# Patient Record
Sex: Female | Born: 1955 | Race: White | Hispanic: No | Marital: Married | Smoking: Current every day smoker
Health system: Southern US, Community
[De-identification: ages and names within clinical notes are randomized; demographics above are authoritative.]

## PROBLEM LIST (undated history)

## (undated) DIAGNOSIS — Z720 Tobacco use: Secondary | ICD-10-CM

## (undated) DIAGNOSIS — I10 Essential (primary) hypertension: Secondary | ICD-10-CM

## (undated) DIAGNOSIS — J45909 Unspecified asthma, uncomplicated: Secondary | ICD-10-CM

## (undated) HISTORY — PX: TONSILLECTOMY: SUR1361

## (undated) HISTORY — PX: NECK SURGERY: SHX720

---

## 2015-01-15 ENCOUNTER — Emergency Department (HOSPITAL_BASED_OUTPATIENT_CLINIC_OR_DEPARTMENT_OTHER): Payer: BLUE CROSS/BLUE SHIELD

## 2015-01-15 ENCOUNTER — Emergency Department (HOSPITAL_COMMUNITY): Payer: BLUE CROSS/BLUE SHIELD

## 2015-01-15 ENCOUNTER — Inpatient Hospital Stay (HOSPITAL_BASED_OUTPATIENT_CLINIC_OR_DEPARTMENT_OTHER)
Admission: EM | Admit: 2015-01-15 | Discharge: 2015-01-17 | DRG: 065 | Disposition: A | Discharge status: Home / Self Care | Payer: BLUE CROSS/BLUE SHIELD | Attending: Internal Medicine | Admitting: Internal Medicine

## 2015-01-15 ENCOUNTER — Encounter (HOSPITAL_BASED_OUTPATIENT_CLINIC_OR_DEPARTMENT_OTHER): Payer: Self-pay

## 2015-01-15 DIAGNOSIS — G8190 Hemiplegia, unspecified affecting unspecified side: Secondary | ICD-10-CM

## 2015-01-15 DIAGNOSIS — Z9119 Patient's noncompliance with other medical treatment and regimen: Secondary | ICD-10-CM | POA: Diagnosis present

## 2015-01-15 DIAGNOSIS — F17210 Nicotine dependence, cigarettes, uncomplicated: Secondary | ICD-10-CM | POA: Diagnosis present

## 2015-01-15 DIAGNOSIS — I639 Cerebral infarction, unspecified: Principal | ICD-10-CM | POA: Diagnosis present

## 2015-01-15 DIAGNOSIS — R531 Weakness: Secondary | ICD-10-CM

## 2015-01-15 DIAGNOSIS — G8191 Hemiplegia, unspecified affecting right dominant side: Secondary | ICD-10-CM

## 2015-01-15 DIAGNOSIS — G43909 Migraine, unspecified, not intractable, without status migrainosus: Secondary | ICD-10-CM | POA: Diagnosis present

## 2015-01-15 DIAGNOSIS — E785 Hyperlipidemia, unspecified: Secondary | ICD-10-CM | POA: Diagnosis present

## 2015-01-15 DIAGNOSIS — Z720 Tobacco use: Secondary | ICD-10-CM | POA: Diagnosis not present

## 2015-01-15 DIAGNOSIS — Z880 Allergy status to penicillin: Secondary | ICD-10-CM | POA: Diagnosis not present

## 2015-01-15 DIAGNOSIS — Z7982 Long term (current) use of aspirin: Secondary | ICD-10-CM | POA: Diagnosis not present

## 2015-01-15 DIAGNOSIS — I10 Essential (primary) hypertension: Secondary | ICD-10-CM | POA: Diagnosis present

## 2015-01-15 DIAGNOSIS — I6789 Other cerebrovascular disease: Secondary | ICD-10-CM | POA: Diagnosis not present

## 2015-01-15 DIAGNOSIS — G8929 Other chronic pain: Secondary | ICD-10-CM | POA: Diagnosis present

## 2015-01-15 DIAGNOSIS — J45909 Unspecified asthma, uncomplicated: Secondary | ICD-10-CM | POA: Diagnosis present

## 2015-01-15 HISTORY — DX: Tobacco use: Z72.0

## 2015-01-15 HISTORY — DX: Unspecified asthma, uncomplicated: J45.909

## 2015-01-15 HISTORY — DX: Essential (primary) hypertension: I10

## 2015-01-15 LAB — CBC
HEMATOCRIT: 43.8 % (ref 36.0–46.0)
Hemoglobin: 15.3 g/dL — ABNORMAL HIGH (ref 12.0–15.0)
MCH: 30.4 pg (ref 26.0–34.0)
MCHC: 34.9 g/dL (ref 30.0–36.0)
MCV: 87.1 fL (ref 78.0–100.0)
PLATELETS: 252 10*3/uL (ref 150–400)
RBC: 5.03 MIL/uL (ref 3.87–5.11)
RDW: 13.3 % (ref 11.5–15.5)
WBC: 6.7 10*3/uL (ref 4.0–10.5)

## 2015-01-15 LAB — COMPREHENSIVE METABOLIC PANEL
ALBUMIN: 4.3 g/dL (ref 3.5–5.2)
ALK PHOS: 67 U/L (ref 39–117)
ALT: 17 U/L (ref 0–35)
AST: 28 U/L (ref 0–37)
Anion gap: 9 (ref 5–15)
BUN: 7 mg/dL (ref 6–23)
CO2: 27 mmol/L (ref 19–32)
CREATININE: 0.72 mg/dL (ref 0.50–1.10)
Calcium: 9.5 mg/dL (ref 8.4–10.5)
Chloride: 99 mmol/L (ref 96–112)
GFR calc Af Amer: >90 mL/min (ref >90)
GFR calc non Af Amer: >90 mL/min (ref >90)
Glucose, Bld: 104 mg/dL — ABNORMAL HIGH (ref 70–99)
Potassium: 3.9 mmol/L (ref 3.5–5.1)
Sodium: 135 mmol/L (ref 135–145)
TOTAL PROTEIN: 7.5 g/dL (ref 6.0–8.3)
Total Bilirubin: 0.6 mg/dL (ref 0.3–1.2)

## 2015-01-15 LAB — DIFFERENTIAL
BASOS PCT: 0 % (ref 0–1)
Basophils Absolute: 0 10*3/uL (ref 0.0–0.1)
EOS ABS: 0.1 10*3/uL (ref 0.0–0.7)
Eosinophils Relative: 1 % (ref 0–5)
Lymphocytes Relative: 35 % (ref 12–46)
Lymphs Abs: 2.3 10*3/uL (ref 0.7–4.0)
MONOS PCT: 12 % (ref 3–12)
Monocytes Absolute: 0.8 10*3/uL (ref 0.1–1.0)
Neutro Abs: 3.5 10*3/uL (ref 1.7–7.7)
Neutrophils Relative %: 52 % (ref 43–77)

## 2015-01-15 LAB — PROTIME-INR
INR: 0.93 (ref 0.00–1.49)
Prothrombin Time: 12.5 seconds (ref 11.6–15.2)

## 2015-01-15 LAB — ETHANOL

## 2015-01-15 LAB — TROPONIN I: Troponin I: <0.03 ng/mL (ref <0.031)

## 2015-01-15 LAB — APTT: aPTT: 28 seconds (ref 24–37)

## 2015-01-15 MED ORDER — SENNOSIDES-DOCUSATE SODIUM 8.6-50 MG PO TABS: 1.0000 | ORAL_TABLET | Freq: Every evening | ORAL | Status: DC | PRN

## 2015-01-15 MED ORDER — ATORVASTATIN CALCIUM 40 MG PO TABS
40.0000 mg | ORAL_TABLET | Freq: Every day | ORAL | Status: DC
  Administered 2015-01-16: 40 mg via ORAL
  Filled 2015-01-15: qty 1

## 2015-01-15 MED ORDER — NICOTINE 21 MG/24HR TD PT24
21.0000 mg | MEDICATED_PATCH | Freq: Every day | TRANSDERMAL | Status: DC
  Administered 2015-01-16 – 2015-01-17 (×2): 21 mg via TRANSDERMAL
  Filled 2015-01-15 (×2): qty 1

## 2015-01-15 MED ORDER — ASPIRIN 300 MG RE SUPP: 300.0000 mg | Freq: Every day | RECTAL | Status: DC

## 2015-01-15 MED ORDER — ASPIRIN 325 MG PO TABS
325.0000 mg | ORAL_TABLET | Freq: Every day | ORAL | Status: DC
  Administered 2015-01-16: 325 mg via ORAL
  Filled 2015-01-15: qty 1

## 2015-01-15 MED ORDER — STROKE: EARLY STAGES OF RECOVERY BOOK
Freq: Once | Status: AC
  Administered 2015-01-15

## 2015-01-15 MED ORDER — HEPARIN SODIUM (PORCINE) 5000 UNIT/ML IJ SOLN
5000.0000 [IU] | Freq: Three times a day (TID) | INTRAMUSCULAR | Status: DC
  Administered 2015-01-15 – 2015-01-17 (×6): 5000 [IU] via SUBCUTANEOUS
  Filled 2015-01-15 (×6): qty 1

## 2015-01-15 NOTE — ED Notes (Signed)
Pt reports she tripped and foot hit brick wall.  Pt reports it gave her a laceration to bottom of right foot and now she has numbness from right knee down.

## 2015-01-15 NOTE — ED Notes (Signed)
Patient transported to CT 

## 2015-01-15 NOTE — ED Notes (Signed)
Doctor at bedside.

## 2015-01-15 NOTE — H&P (Signed)
[facility] History and Physical  Olivia DestineJamie Lin UJW:[id number]:[id number] DOB: April 21, 1956 DOA: 01/15/2015  Referring physician: ED physician PCP: Cheral BayHAWKS,ALDENE N, MD  Specialists:   Chief Complaint: right leg weakness and numbness  HPI: Olivia DestineJamie Lin is a 59 y.o. female with past medical history for hypertension, tobacco abuse, who presents with right leg weakness and numbness  Patient reports that she has been having right-sided weakness and numbness in the past 2 days, mainly in right leg. She states that Monday, she tripped and injured her right foot. Reports falling on her right hip during this episode.   ROS: currently patient denies fever, chills, fatigue, running nose, ear pain, headaches, cough, chest pain, SOB, abdominal pain, diarrhea, constipation, dysuria, urgency, frequency, hematuria, skin rashes, joint pain or leg swelling. No vision change or hearing loss. Patient was seen in ED by dr. Hosie PoissonSumner who recommended to check MRI brain and L spine without contrast, which showed multiple small parasaggital infarcts bilaterally.   In ED, patient was found to have negative troponin, INR 0.73, temperature normal, no tachycardia, electrolytes okay. CT head is negative for acute abnormalities. Patient is admitted to inpatient for further evaluation and treatment.  Review of Systems: As presented in the history of presenting illness, rest negative.  Where does patient live?  At home Can patient participate in ADLs? Yes  Allergy:  Allergies  Allergen Reactions  . Penicillins Other (See Comments)    Past Medical History  Diagnosis Date  . Hypertension   . Tobacco abuse     Past Surgical History  Procedure Laterality Date  . Neck surgery    . Tonsillectomy      Social History:  reports that she has been smoking.  She does not have any smokeless tobacco history on file. She reports that she drinks alcohol. She reports that she does not use illicit drugs.  Family History:  Family  History  Problem Relation Age of Onset  . COPD Mother   . Breast cancer Mother   . Stroke Father   . Diabetes Sister   . Stroke Sister      Prior to Admission medications   Medication Sig Start Date End Date Taking? Authorizing Provider  ibuprofen (ADVIL,MOTRIN) 200 MG tablet Take 200 mg by mouth every 6 (six) hours as needed for fever.   Yes Historical Provider, MD  metaxalone (SKELAXIN) 800 MG tablet Take 800 mg by mouth daily as needed for muscle spasms (HEADACHES).   Yes Historical Provider, MD    Physical Exam: Filed Vitals:   01/16/15 0000 01/16/15 0200 01/16/15 0400 01/16/15 0602  BP: 184/84 146/88 167/83 141/85  Pulse: 72 67 64 74  Temp: 97.5 F (36.4 C) 97.9 F (36.6 C) 97.9 F (36.6 C) 97.9 F (36.6 C)  TempSrc: Oral Oral Oral Oral  Resp: 18 20 18 18   Height:      Weight:      SpO2: 94% 96% 97% 96%   General: Not in acute distress HEENT:       Eyes: PERRL, EOMI, no scleral icterus       ENT: No discharge from the ears and nose, no pharynx injection, no tonsillar enlargement.        Neck: No JVD, no bruit, no mass felt. Cardiac: S1/S2, RRR, No murmurs, No gallops or rubs Pulm: Good air movement bilaterally. Clear to auscultation bilaterally. No rales, wheezing, rhonchi or rubs. Abd: Soft, nondistended, nontender, no rebound pain, no organomegaly, BS present Ext: No edema bilaterally. 2+DP/PT pulse bilaterally Musculoskeletal:  No joint deformities, erythema, or stiffness, ROM full Skin: No rashes.  Neuro: Alert and oriented X3, cranial nerves II-XII grossly intact, muscle strength 3/5 in right leg, 5/5 in other extremeties, sensation to light touch decreased in right lower leg.  Brachial reflex 2+ bilaterally. Knee reflex 1+ bilaterally. Positive Babinski's sign on the right side. Normal finger to nose test. Psych: Patient is not psychotic, no suicidal or hemocidal ideation.  Labs on Admission:  Basic Metabolic Panel:  Recent Labs Lab 01/15/15 1355  NA 135   K 3.9  CL 99  CO2 27  GLUCOSE 104*  BUN 7  CREATININE 0.72  CALCIUM 9.5   Liver Function Tests:  Recent Labs Lab 01/15/15 1355  AST 28  ALT 17  ALKPHOS 67  BILITOT 0.6  PROT 7.5  ALBUMIN 4.3   No results for input(s): LIPASE, AMYLASE in the last 168 hours. No results for input(s): AMMONIA in the last 168 hours. CBC:  Recent Labs Lab 01/15/15 1355  WBC 6.7  NEUTROABS 3.5  HGB 15.3*  HCT 43.8  MCV 87.1  PLT 252   Cardiac Enzymes:  Recent Labs Lab 01/15/15 1355  TROPONINI <0.03    BNP (last 3 results) No results for input(s): BNP in the last 8760 hours.  ProBNP (last 3 results) No results for input(s): PROBNP in the last 8760 hours.  CBG: No results for input(s): GLUCAP in the last 168 hours.  Radiological Exams on Admission: Ct Head Wo Contrast  01/15/2015   CLINICAL DATA:  Tripped and fell today, dizziness, denies hitting head, initial encounter  EXAM: CT HEAD WITHOUT CONTRAST  TECHNIQUE: Contiguous axial images were obtained from the base of the skull through the vertex without intravenous contrast.  COMPARISON:  None  FINDINGS: Normal ventricular morphology.  No midline shift or mass effect.  Normal appearance of brain parenchyma.  No intracranial hemorrhage, mass lesion, or acute infarction.  Visualized paranasal sinuses and mastoid air cells clear.  Bones unremarkable.  IMPRESSION: Normal exam.   Electronically Signed   By: Ulyses Southward M.D.   On: 01/15/2015 14:12   Mr Brain Wo Contrast  01/15/2015   CLINICAL DATA:  Fall. Right-sided weakness for 2 days. Right lower extremity numbness.  EXAM: MRI HEAD WITHOUT CONTRAST  TECHNIQUE: Multiplanar, multiecho pulse sequences of the brain and surrounding structures were obtained without intravenous contrast.  COMPARISON:  Head CT earlier today  FINDINGS: Limited visualization of the upper cervical spine demonstrates disc bulging at C4-5 with mild impression on the spinal cord.  There are numerous small, patchy  areas of acute cortical and subcortical infarction predominantly located in the parasagittal regions of the left greater than right parietal lobes and left frontal lobe. There is no evidence of intracranial hemorrhage, mass, midline shift, or extra-axial fluid collection. There is mild generalized cerebral atrophy. Patchy T2 hyperintensities in the subcortical and deep cerebral white matter separate from the areas of acute infarction are nonspecific but compatible with mild chronic small vessel ischemic disease, mildly advanced for age. There is a chronic lacunar infarct in the left thalamus.  Orbits are unremarkable. Paranasal sinuses are clear. There small bilateral mastoid effusions. The distal right vertebral artery is not well seen and may be congenitally hypoplastic or stenotic/ occluded. Other major intracranial vascular flow voids are preserved.  IMPRESSION: 1. 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 2. Mild chronic small vessel ischemic disease. 3. Hypoplastic versus stenotic or occluded distal right vertebral artery.  Electronically Signed   By: Sebastian Ache   On: 01/15/2015 20:11   Mr Lumbar Spine Wo Contrast  01/15/2015   CLINICAL DATA:  Right-sided weakness for 2 days.  EXAM: MRI LUMBAR SPINE WITHOUT CONTRAST  TECHNIQUE: Multiplanar, multisequence MR imaging of the lumbar spine was performed. No intravenous contrast was administered.  COMPARISON:  Lumbar spine radiographs 01/08/2010  FINDINGS: Five lumbar type vertebral bodies. There is slight left convex curvature of the lumbar spine. Trace retrolisthesis is noted of L2 on L3. There is disc desiccation and moderate disc space narrowing throughout the lumbar spine with multilevel degenerative endplate changes including mild edema. Vertebral body heights are preserved. The lumbar spinal canal is mildly narrow diffusely on a congenital basis due to short pedicles. The conus medullaris is normal  in signal and terminates at the superior aspect of L1. Paraspinal soft tissues are unremarkable.  T12-L1:  Mild disc bulging without stenosis.  L1-2: Mild circumferential disc bulging with small superimposed central disc extrusion and minimal facet hypertrophy. These changes along with congenitally short pedicles contribute to mild spinal stenosis and mild right neural foraminal stenosis.  L2-3: Moderate circumferential disc bulging, mild facet hypertrophy, and congenitally short pedicles result in mild-to-moderate spinal stenosis, moderate bilateral lateral recess stenosis, and mild bilateral neural foraminal stenosis.  L3-4: Mild-to-moderate circumferential disc bulging, mild facet hypertrophy, and congenitally short pedicles result in moderate spinal stenosis, moderate bilateral lateral recess stenosis, and mild bilateral neural foraminal stenosis.  L4-5: Mild-to-moderate circumferential disc bulging, mild facet hypertrophy, and congenitally short pedicles result in moderate spinal stenosis, moderate right and severe left lateral recess stenosis, and moderate bilateral neural foraminal stenosis.  L5-S1: Mild disc bulging and mild facet hypertrophy result in mild left greater than right lateral recess stenosis and mild bilateral neural foraminal stenosis. No spinal stenosis.  IMPRESSION: Moderate diffuse lumbar disc degeneration superimposed on a congenitally narrow lumbar spinal canal, worst at L4-5 where spinal stenosis is moderate.   Electronically Signed   By: Sebastian Ache   On: 01/15/2015 20:05    EKG: Independently reviewed. No ischemic changes   Assessment/Plan Principal Problem:   Stroke Active Problems:   Right hemiparesis   Hypertension   Tobacco abuse  Stroke: as evidenced by MRI. Dr. Thad Ranger evaluated the patient again after MRI findings were obtained  -will admit to tele bed and follow up dr. Thad Ranger recommendations to follows: 1. HgbA1c, fasting lipid panel 2. BP control 3. PT  consult, OT consult, Speech consult 4. Echocardiogram 5. Carotid dopplers 6. Prophylactic therapy-Antiplatelet med: Aspirin - dose  daily 7. NPO until RN stroke swallow screen 8. Telemetry monitoring 9. Frequent neuro checks -will start lipitor -check UDS and HIV ab  HTN: Patient is not on medications at home. Her blood pressure is 193/99. -hold blood pressure medications in the setting of acute stroke.  Tobacco abuse: -did conseling about the importance of quitting smoking - Nicotine patch  DVT ppx: SQ Heparin        Code Status: Full code Family Communication: Yes, patient's  Husband and son     at bed side Disposition Plan: Admit to inpatient   Date of Service 01/16/2015    Lorretta Harp [facility] Pager [phone]  If 7PM-7AM, please contact night-coverage [email or web address] Password [facility] 01/16/2015, 6:40 AM

## 2015-01-15 NOTE — ED Provider Notes (Signed)
CSN: [id number]     Arrival date & time 01/15/15  1032 History   First MD Initiated Contact with Patient 01/15/15 1212     Chief Complaint  Patient presents with  . Fall     (Consider location/radiation/quality/duration/timing/severity/associated sxs/prior Treatment) HPI Social 59 year old female with a past medical history of hypertension. She is a chronic daily smoker. The patient presents to the emergency department with chief complaint of right leg weakness and numbness. The patient states that on Monday evening she was walking down her front pathway when she tripped over her flip flop and ran the bottom of her heel into the edge of a concrete block. Patient states it bled. She washed it up. She was able to go about with her normal daily activities. Patient states that yesterday morning she awoke and got up to use the bathroom and noticed that her right leg felt normal from the knee down and that it was not supporting her well. She had to use her arms to help herself get to the bathroom by holding onto surrounding furniture and walls. Her husband states that he awoke when he heard her hit the floor. Since that time. She states that her right leg is not holding her. It continues to give out. She has continued numbness from the knee down. She has a history of migraine headaches which she gets frequently and has had one during the past week. She denies any other symptoms such as visual changes, vertigo, difficulty with speech, swallowing or memory. She has a family history of strokes. She denies any other injuries, history of lower back problems. Past Medical History  Diagnosis Date  . Hypertension    Past Surgical History  Procedure Laterality Date  . Neck surgery    . Tonsillectomy     No family history on file. History  Substance Use Topics  . Smoking status: Current Every Day Smoker  . Smokeless tobacco: Not on file  . Alcohol Use: Yes     Comment: occ   OB History    No data  available     Review of Systems  Ten systems reviewed and are negative for acute change, except as noted in the HPI.    Allergies  Penicillins  Home Medications   Prior to Admission medications   Not on File   BP 193/99 mmHg  Pulse 90  Temp(Src) 97.7 F (36.5 C) (Oral)  Resp 18  Ht 5' (1.524 m)  Wt 115 lb (52.164 kg)  BMI 22.46 kg/m2  SpO2 99% Physical Exam  Constitutional: She is oriented to person, place, and time. She appears well-developed and well-nourished. No distress.  HENT:  Head: Normocephalic and atraumatic.  Eyes: Conjunctivae are normal. No scleral icterus.  Neck: Normal range of motion.  Cardiovascular: Normal rate, regular rhythm and normal heart sounds.  Exam reveals no gallop and no friction rub.   No murmur heard. Pulmonary/Chest: Effort normal and breath sounds normal. No respiratory distress.  Abdominal: Soft. Bowel sounds are normal. She exhibits no distension and no mass. There is no tenderness. There is no guarding.  Neurological: She is alert and oriented to person, place, and time.  Neurological exam reveals alert, oriented, normal speech, no focal findings or movement disorder noted, cranial nerves II through XII intact, Normal finger to nose abnormal findings: R arm and leg weakness especially in the proximal muscles, upper reflexes, patellar tendon. Negative clonus or Babinski's, hemiparesis on left. Normal finger to nose. Abnormal gait with ataxia. Able  to heel stand but unable to walk on heels or toes. No pronator drift   Skin: Skin is warm and dry. She is not diaphoretic.  Nursing note and vitals reviewed.   ED Course  Procedures (including critical care time) Labs Review Labs Reviewed  PROTIME-INR  APTT  CBC  DIFFERENTIAL  COMPREHENSIVE METABOLIC PANEL  URINE RAPID DRUG SCREEN (HOSP PERFORMED)  URINALYSIS, ROUTINE W REFLEX MICROSCOPIC  ETHANOL  TROPONIN I    Imaging Review No results found.   EKG Interpretation None       MDM   Final diagnoses:  Right hemiparesis   BP 193/99 mmHg  Pulse 90  Temp(Src) 97.7 F (36.5 C) (Oral)  Resp 18  Ht 5' (1.524 m)  Wt 115 lb (52.164 kg)  BMI 22.46 kg/m2  SpO2 99%  Patient with a concerning neurologic findings of right sided hemiparesis. She is seen in shared visit with Dr. Micheline Maze . The code stroke was not called as he has been greater than 24 hours since onset of her symptoms. Has strong concern for stroke, as these cause of this process. I have ordered a stroke workup including a CT of the head at this point. Likely patient will need MRI. All findings discussed with the patient.    SHEENT with right-sided hemiparesis. Concern for stroke. Her workup here is negative, except for continuous hypertension. Patient appears safe to go by car. I have spoken with the charge nurse at [facility] as well as Dr. Mancel Bale who accepted the patient.  Arthor Captain, PA-C 01/15/15 1622  Toy Cookey, MD 01/15/15 [phone]

## 2015-01-15 NOTE — ED Notes (Signed)
Dr. Loretha StaplerWofford at bedside. Pt given Malaw[place]urkey sandwich and cup of coca-cola.

## 2015-01-15 NOTE — Progress Notes (Signed)
Progress Note: MRI of the brain personally reviewed and shows multiple small parasaggital infarcts bilaterally.  Cardiac source is in the differential.  Patient on no antiplatelet therapy at home.  BP poorly controlled.  Will require further work up and management.  Stroke team to follow.  Recommendations: 1. HgbA1c, fasting lipid panel 2. BP control 3. PT consult, OT consult, Speech consult 4. Echocardiogram 5. Carotid dopplers 6. Prophylactic therapy-Antiplatelet med: Aspirin - dose 325mg  daily 7. NPO until RN stroke swallow screen 8. Telemetry monitoring 9. Frequent neuro checks  Olivia FarrLeslie Solimar Maiden, MD [facility] [phone]

## 2015-01-15 NOTE — Consult Note (Signed)
Stroke Consult    Chief Complaint: right sided weakness  HPI: Olivia Lin is an 59 y.o. female hx of HTN, chronic smoker presenting to ED with right sided weakness x 2 days. Notes Monday, tripped and injured her right foot. Reports falling on her right hip during this episode. Denies any lumbar back pain. Woke up following morning, noted heaviness and weakness in RLE. Difficulty standing due to weakness. Notes numbness in RLE from knee down. Went to [facility] ED for further evaluation. There, noted to have both RUE and RLE weakness, concern for CVA so transferred to [facility] for further evaluation.   CT head imaging reviewed and overall unremarkable. Of note, BP 190/111 upon arrival to ED.   Date last known well: 01/13/2015 Time last known well: unclear tPA Given: no, outside tPA window  Past Medical History  Diagnosis Date  . Hypertension     Past Surgical History  Procedure Laterality Date  . Neck surgery    . Tonsillectomy      No family history on file. Social History:  reports that she has been smoking.  She does not have any smokeless tobacco history on file. She reports that she drinks alcohol. She reports that she does not use illicit drugs.  Allergies:  Allergies  Allergen Reactions  . Penicillins Other (See Comments)     (Not in a hospital admission)  ROS: Out of a complete 14 system review, the patient complains of only the following symptoms, and all other reviewed systems are negative. + weakness, gait instability   Physical Examination: Filed Vitals:   01/15/15 1815  BP: 166/72  Pulse: 95  Temp:   Resp: 16   Physical Exam  Constitutional: He appears well-developed and well-nourished.  Psych: Affect appropriate to situation Eyes: No scleral injection HENT: No OP obstrucion Head: Normocephalic.  Cardiovascular: Normal rate and regular rhythm.  Respiratory: Effort normal and breath sounds normal.  GI: Soft. Bowel sounds are normal. No distension.  There is no tenderness.  Skin: WDI   Neurologic Examination: Mental Status: Alert, oriented, thought content appropriate. Speech fluent without evidence of aphasia.  No dysarthria. Able to follow 3 step commands without difficulty. Cranial Nerves: II: funduscopic exam wnl bilaterally, visual fields grossly normal, pupils equal, round, reactive to light and accommodation III,IV, VI: ptosis not present, extra-ocular motions intact bilaterally V,VII: smile symmetric, facial light touch sensation normal bilaterally VIII: hearing normal bilaterally IX,X: gag reflex present XI: trapezius strength/neck flexion strength normal bilaterally XII: tongue strength normal  Motor: Right : Upper extremity    Left:     Upper extremity 4+/5 deltoid       5/5 deltoid 5/5 biceps      5/5 biceps  5/5 triceps      5/5 triceps 5/5 hand grip      5/5 hand grip  Lower extremity     Lower extremity 4-/5 hip flexor      5/5 hip flexor 4-/5 quadricep      5/5 quadriceps  4-/5 hamstrings     5/5 hamstrings 5-/5 plantar flexion       5/5 plantar flexion 5-/5 plantar extension     5/5 plantar extension Tone and bulk:normal tone throughout; no atrophy noted Sensory: Pinprick and light touch intact throughout, bilaterally Deep Tendon Reflexes: 2+ and symmetric throughout with exception of 3+ patellar bilaterally Plantars: Right: downgoing   Left: downgoing Cerebellar: normal finger-to-nose, difficulty with R HTS Gait: deferred  Laboratory Studies:   Basic Metabolic Panel:  Recent Labs Lab 01/15/15 1355  NA 135  K 3.9  CL 99  CO2 27  GLUCOSE 104*  BUN 7  CREATININE 0.72  CALCIUM 9.5    Liver Function Tests:  Recent Labs Lab 01/15/15 1355  AST 28  ALT 17  ALKPHOS 67  BILITOT 0.6  PROT 7.5  ALBUMIN 4.3   No results for input(s): LIPASE, AMYLASE in the last 168 hours. No results for input(s): AMMONIA in the last 168 hours.  CBC:  Recent Labs Lab 01/15/15 1355  WBC 6.7  NEUTROABS  3.5  HGB 15.3*  HCT 43.8  MCV 87.1  PLT 252    Cardiac Enzymes:  Recent Labs Lab 01/15/15 1355  TROPONINI <0.03    BNP: Invalid input(s): POCBNP  CBG: No results for input(s): GLUCAP in the last 168 hours.  Microbiology: No results found for this or any previous visit.  Coagulation Studies:  Recent Labs  01/15/15 1355  LABPROT 12.5  INR 0.93    Urinalysis: No results for input(s): COLORURINE, LABSPEC, PHURINE, GLUCOSEU, HGBUR, BILIRUBINUR, KETONESUR, PROTEINUR, UROBILINOGEN, NITRITE, LEUKOCYTESUR in the last 168 hours.  Invalid input(s): APPERANCEUR  Lipid Panel:  No results found for: CHOL, TRIG, HDL, CHOLHDL, VLDL, LDLCALC  HgbA1C: No results found for: HGBA1C  Urine Drug Screen:  No results found for: LABOPIA, COCAINSCRNUR, LABBENZ, AMPHETMU, THCU, LABBARB  Alcohol Level:  Recent Labs Lab 01/15/15 1355  ETH <5    Other results:  Imaging: Ct Head Wo Contrast  01/15/2015   CLINICAL DATA:  Tripped and fell today, dizziness, denies hitting head, initial encounter  EXAM: CT HEAD WITHOUT CONTRAST  TECHNIQUE: Contiguous axial images were obtained from the base of the skull through the vertex without intravenous contrast.  COMPARISON:  None  FINDINGS: Normal ventricular morphology.  No midline shift or mass effect.  Normal appearance of brain parenchyma.  No intracranial hemorrhage, mass lesion, or acute infarction.  Visualized paranasal sinuses and mastoid air cells clear.  Bones unremarkable.  IMPRESSION: Normal exam.   Electronically Signed   By: Ulyses SouthwardMark  Boles M.D.   On: 01/15/2015 14:12    Assessment: 59 y.o. female hx of HTN, chronic smoking presents for evaluation of fall with subsequent RLE weakness and numbness. In ED noted to have notable RLE weakness and more subtle RUE proximal weakness. Cannot rule out CVA. Would also consider a lumbar pathology as etiology of RLE weakness though this would not explain RUE weakness.   -check MRI brain and L spine  without contrast -suggest daily ASA 81mg  -needs PCP follow up for BP control -if MRI brain and L spine unremarkable would benefit from outpatient EMG/NCS of RLE -if unsafe to discharge due to instability may need admission for rehab evalauation -will follow up post MRI   Elspeth Choeter Johnwesley Lederman, DO [facility] [phone]  If 7pm- 7am, please page neurology on call as listed in AMION. 01/15/2015, 6:20 PM

## 2015-01-15 NOTE — ED Notes (Signed)
[facility]

## 2015-01-15 NOTE — ED Provider Notes (Signed)
CSN: [id number]     Arrival date & time 01/15/15  1032 History   First MD Initiated Contact with Patient 01/15/15 1212     Chief Complaint  Patient presents with  . Fall     (Consider location/radiation/quality/duration/timing/severity/associated sxs/prior Treatment) HPI Comments: 59 year old female presenting from [facility] due to concern for CVA. She initially went to the emergency department there due to a fall 2 days ago, during which time she injured her right foot. Since that time, she has fallen a number of other times. She has paresthesias below her right knee. She also has difficulty ambulating, weakness in right leg, weakness in right arm. She believes her weakness started yesterday. She first noticed it when she tried to get out of bed and ambulate to the bathroom.  Patient is a 59 y.o. female presenting with neurologic complaint.  Neurologic Problem This is a new problem. The current episode started yesterday. The problem occurs constantly. The problem has not changed since onset.Associated symptoms include headaches ("migraine" for past several days, now resolving. ). Pertinent negatives include no chest pain and no shortness of breath. The symptoms are aggravated by walking. Nothing relieves the symptoms. She has tried nothing for the symptoms.    Past Medical History  Diagnosis Date  . Hypertension    Past Surgical History  Procedure Laterality Date  . Neck surgery    . Tonsillectomy     No family history on file. History  Substance Use Topics  . Smoking status: Current Every Day Smoker  . Smokeless tobacco: Not on file  . Alcohol Use: Yes     Comment: occ   OB History    No data available     Review of Systems  Respiratory: Negative for shortness of breath.   Cardiovascular: Negative for chest pain.  Neurological: Positive for headaches ("migraine" for past several days, now resolving. ).  All other systems reviewed and are  negative.     Allergies  Penicillins  Home Medications   Prior to Admission medications   Medication Sig Start Date End Date Taking? Authorizing Provider  ibuprofen (ADVIL,MOTRIN) 200 MG tablet Take 200 mg by mouth every 6 (six) hours as needed for fever.   Yes Historical Provider, MD  metaxalone (SKELAXIN) 800 MG tablet Take 800 mg by mouth daily as needed for muscle spasms (HEADACHES).   Yes Historical Provider, MD   BP 166/72 mmHg  Pulse 95  Temp(Src) 98.1 F (36.7 C) (Oral)  Resp 16  Ht 5' (1.524 m)  Wt 115 lb (52.164 kg)  BMI 22.46 kg/m2  SpO2 98% Physical Exam  Constitutional: She is oriented to person, place, and time. She appears well-developed and well-nourished. No distress.  HENT:  Head: Normocephalic and atraumatic.  Eyes: Conjunctivae are normal. No scleral icterus.  Neck: Neck supple.  Cardiovascular: Normal rate and intact distal pulses.   Pulmonary/Chest: Effort normal. No stridor. No respiratory distress.  Abdominal: Normal appearance. She exhibits no distension.  Neurological: She is alert and oriented to person, place, and time. Gait abnormal.  RLE: 4/5 strength, worse distally. RUE: 3+/5 strength, worse proximally.  LLE and LUE strength normal.    Skin: Skin is warm and dry. No rash noted.  Small partially healed laceration to right plantar surface of foot.   Psychiatric: She has a normal mood and affect. Her behavior is normal.  Nursing note and vitals reviewed.   ED Course  Procedures (including critical care time) Labs Review Labs Reviewed  CBC - Abnormal; Notable for the following:    Hemoglobin 15.3 (*)    All other components within normal limits  COMPREHENSIVE METABOLIC PANEL - Abnormal; Notable for the following:    Glucose, Bld 104 (*)    All other components within normal limits  PROTIME-INR  APTT  DIFFERENTIAL  ETHANOL  TROPONIN I  URINE RAPID DRUG SCREEN (HOSP PERFORMED)  URINALYSIS, ROUTINE W REFLEX MICROSCOPIC    Imaging  Review Ct Head Wo Contrast  01/15/2015   CLINICAL DATA:  Tripped and fell today, dizziness, denies hitting head, initial encounter  EXAM: CT HEAD WITHOUT CONTRAST  TECHNIQUE: Contiguous axial images were obtained from the base of the skull through the vertex without intravenous contrast.  COMPARISON:  None  FINDINGS: Normal ventricular morphology.  No midline shift or mass effect.  Normal appearance of brain parenchyma.  No intracranial hemorrhage, mass lesion, or acute infarction.  Visualized paranasal sinuses and mastoid air cells clear.  Bones unremarkable.  IMPRESSION: Normal exam.   Electronically Signed   By: Ulyses Southward M.D.   On: 01/15/2015 14:12   Mr Brain Wo Contrast  01/15/2015   CLINICAL DATA:  Fall. Right-sided weakness for 2 days. Right lower extremity numbness.  EXAM: MRI HEAD WITHOUT CONTRAST  TECHNIQUE: Multiplanar, multiecho pulse sequences of the brain and surrounding structures were obtained without intravenous contrast.  COMPARISON:  Head CT earlier today  FINDINGS: Limited visualization of the upper cervical spine demonstrates disc bulging at C4-5 with mild impression on the spinal cord.  There are numerous small, patchy areas of acute cortical and subcortical infarction predominantly located in the parasagittal regions of the left greater than right parietal lobes and left frontal lobe. There is no evidence of intracranial hemorrhage, mass, midline shift, or extra-axial fluid collection. There is mild generalized cerebral atrophy. Patchy T2 hyperintensities in the subcortical and deep cerebral white matter separate from the areas of acute infarction are nonspecific but compatible with mild chronic small vessel ischemic disease, mildly advanced for age. There is a chronic lacunar infarct in the left thalamus.  Orbits are unremarkable. Paranasal sinuses are clear. There small bilateral mastoid effusions. The distal right vertebral artery is not well seen and may be congenitally hypoplastic  or stenotic/ occluded. Other major intracranial vascular flow voids are preserved.  IMPRESSION: 1. 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 2. Mild chronic small vessel ischemic disease. 3. Hypoplastic versus stenotic or occluded distal right vertebral artery.   Electronically Signed   By: Sebastian Ache   On: 01/15/2015 20:11   Mr Lumbar Spine Wo Contrast  01/15/2015   CLINICAL DATA:  Right-sided weakness for 2 days.  EXAM: MRI LUMBAR SPINE WITHOUT CONTRAST  TECHNIQUE: Multiplanar, multisequence MR imaging of the lumbar spine was performed. No intravenous contrast was administered.  COMPARISON:  Lumbar spine radiographs 01/08/2010  FINDINGS: Five lumbar type vertebral bodies. There is slight left convex curvature of the lumbar spine. Trace retrolisthesis is noted of L2 on L3. There is disc desiccation and moderate disc space narrowing throughout the lumbar spine with multilevel degenerative endplate changes including mild edema. Vertebral body heights are preserved. The lumbar spinal canal is mildly narrow diffusely on a congenital basis due to short pedicles. The conus medullaris is normal in signal and terminates at the superior aspect of L1. Paraspinal soft tissues are unremarkable.  T12-L1:  Mild disc bulging without stenosis.  L1-2: Mild circumferential disc bulging with small superimposed central disc extrusion and  minimal facet hypertrophy. These changes along with congenitally short pedicles contribute to mild spinal stenosis and mild right neural foraminal stenosis.  L2-3: Moderate circumferential disc bulging, mild facet hypertrophy, and congenitally short pedicles result in mild-to-moderate spinal stenosis, moderate bilateral lateral recess stenosis, and mild bilateral neural foraminal stenosis.  L3-4: Mild-to-moderate circumferential disc bulging, mild facet hypertrophy, and congenitally short pedicles result in moderate spinal stenosis, moderate  bilateral lateral recess stenosis, and mild bilateral neural foraminal stenosis.  L4-5: Mild-to-moderate circumferential disc bulging, mild facet hypertrophy, and congenitally short pedicles result in moderate spinal stenosis, moderate right and severe left lateral recess stenosis, and moderate bilateral neural foraminal stenosis.  L5-S1: Mild disc bulging and mild facet hypertrophy result in mild left greater than right lateral recess stenosis and mild bilateral neural foraminal stenosis. No spinal stenosis.  IMPRESSION: Moderate diffuse lumbar disc degeneration superimposed on a congenitally narrow lumbar spinal canal, worst at L4-5 where spinal stenosis is moderate.   Electronically Signed   By: Sebastian Ache   On: 01/15/2015 20:05     EKG Interpretation   Date/Time:  Wednesday January 15 2015 14:15:59 EDT Ventricular Rate:  64 PR Interval:  152 QRS Duration: 80 QT Interval:  422 QTC Calculation: 435 R Axis:   83 Text Interpretation:  Normal sinus rhythm Normal ECG No significant change  since last tracing Confirmed by DOCHERTY  MD, MEGAN ([id number]) on 01/15/2015  2:19:39 PM      MDM   Final diagnoses:  Right hemiparesis  Right sided weakness  CVA  Have consulted neurology and ordered MR brain.  Neuro requested also adding MR L spine.    MRI positive for stroke.  Discussed with Dr. Thad Ranger and Dr. Clyde Lundborg.  Plan admit.  No tPA given due to time since onset of symptoms.   Blake Divine, MD 01/16/15 [phone]

## 2015-01-15 NOTE — Discharge Instructions (Signed)
Please go immediately to the Emergency department. You will need a further workup for a stroke.

## 2015-01-15 NOTE — ED Notes (Signed)
Neurology at bedside.

## 2015-01-16 ENCOUNTER — Encounter (HOSPITAL_COMMUNITY): Payer: Self-pay | Admitting: Internal Medicine

## 2015-01-16 ENCOUNTER — Inpatient Hospital Stay (HOSPITAL_COMMUNITY): Payer: BLUE CROSS/BLUE SHIELD

## 2015-01-16 DIAGNOSIS — I10 Essential (primary) hypertension: Secondary | ICD-10-CM | POA: Insufficient documentation

## 2015-01-16 DIAGNOSIS — E785 Hyperlipidemia, unspecified: Secondary | ICD-10-CM | POA: Insufficient documentation

## 2015-01-16 LAB — URINALYSIS, ROUTINE W REFLEX MICROSCOPIC
Bilirubin Urine: NEGATIVE
GLUCOSE, UA: NEGATIVE mg/dL
Hgb urine dipstick: NEGATIVE
Ketones, ur: NEGATIVE mg/dL
LEUKOCYTES UA: NEGATIVE
Nitrite: NEGATIVE
Protein, ur: NEGATIVE mg/dL
SPECIFIC GRAVITY, URINE: 1.01 (ref 1.005–1.030)
UROBILINOGEN UA: 0.2 mg/dL (ref 0.0–1.0)
pH: 5.5 (ref 5.0–8.0)

## 2015-01-16 LAB — RAPID URINE DRUG SCREEN, HOSP PERFORMED
Amphetamines: NOT DETECTED
Barbiturates: NOT DETECTED
Benzodiazepines: NOT DETECTED
Cocaine: NOT DETECTED
Opiates: NOT DETECTED
Tetrahydrocannabinol: NOT DETECTED

## 2015-01-16 LAB — LIPID PANEL
Cholesterol: 163 mg/dL (ref 0–200)
HDL: 73 mg/dL (ref >39)
LDL Cholesterol: 79 mg/dL (ref 0–99)
Total CHOL/HDL Ratio: 2.2 RATIO
Triglycerides: 53 mg/dL (ref <150)
VLDL: 11 mg/dL (ref 0–40)

## 2015-01-16 LAB — HIV ANTIBODY (ROUTINE TESTING W REFLEX): HIV Screen 4th Generation wRfx: NONREACTIVE

## 2015-01-16 MED ORDER — TETANUS-DIPHTH-ACELL PERTUSSIS 5-2.5-18.5 LF-MCG/0.5 IM SUSP
0.5000 mL | Freq: Once | INTRAMUSCULAR | Status: DC
  Filled 2015-01-16 (×2): qty 0.5

## 2015-01-16 MED ORDER — TETANUS-DIPHTH-ACELL PERTUSSIS 5-2.5-18.5 LF-MCG/0.5 IM SUSP
0.5000 mL | Freq: Once | INTRAMUSCULAR | Status: AC
  Administered 2015-01-16: 0.5 mL via INTRAMUSCULAR
  Filled 2015-01-16: qty 0.5

## 2015-01-16 MED ORDER — IOHEXOL 350 MG/ML SOLN
100.0000 mL | Freq: Once | INTRAVENOUS | Status: AC | PRN
  Administered 2015-01-16: 100 mL via INTRAVENOUS

## 2015-01-16 MED ORDER — TRAMADOL HCL 50 MG PO TABS
50.0000 mg | ORAL_TABLET | Freq: Four times a day (QID) | ORAL | Status: DC | PRN
  Administered 2015-01-16: 50 mg via ORAL
  Filled 2015-01-16: qty 1

## 2015-01-16 MED ORDER — ASPIRIN EC 81 MG PO TBEC
81.0000 mg | DELAYED_RELEASE_TABLET | Freq: Every day | ORAL | Status: DC
  Administered 2015-01-17: 81 mg via ORAL
  Filled 2015-01-16: qty 1

## 2015-01-16 MED ORDER — CLOPIDOGREL BISULFATE 75 MG PO TABS
75.0000 mg | ORAL_TABLET | Freq: Every day | ORAL | Status: DC
  Administered 2015-01-16 – 2015-01-17 (×2): 75 mg via ORAL
  Filled 2015-01-16 (×2): qty 1

## 2015-01-16 MED ORDER — PNEUMOCOCCAL VAC POLYVALENT 25 MCG/0.5ML IJ INJ
0.5000 mL | INJECTION | INTRAMUSCULAR | Status: AC
  Administered 2015-01-17: 0.5 mL via INTRAMUSCULAR
  Filled 2015-01-16: qty 0.5

## 2015-01-16 NOTE — Progress Notes (Signed)
Pt arrived to floor in room 4N25 from ED stretcher. Able to stand and pivot to bed. Tele placed on box4N25. Education provided for patient and family. Given handout. Pt have questions and concerns about BP medications and also requesting tetanus shot due to recent fall. Will have day shift nurse and MD follow up with these concerns. Oriented to room and equipment. Will continue to monitor.

## 2015-01-16 NOTE — Progress Notes (Signed)
OT Cancellation Note  Patient Details Name: Alba DestineJamie Hogge MRN: [id number] DOB: 1955-12-18   Cancelled Treatment:    Reason Eval/Treat Not Completed: Other (comment) (Pt has active bed rest order.) OT to reattempt when activity order increased as schedule permits.  Pilar GrammesMathews, Jaselynn Tamas H 01/16/2015, 9:26 AM

## 2015-01-16 NOTE — Progress Notes (Signed)
CARE MANAGEMENT NOTE 01/16/2015  Patient:  Olivia Lin,Olivia Lin   Account Number:  [id number]  Date Initiated:  01/16/2015  Documentation initiated by:  Jiles CrockerHANDLER,Jaivon Vanbeek  Subjective/Objective Assessment:   ADMITTED WITH STROKE     Action/Plan:   CM FOLLOWING FOR DCP   Anticipated DC Date:  01/19/2015   Anticipated DC Plan:  AWAITING FOR PT/OT EVAL FOR DISPOSITION NEEDS     DC Planning Services  CM consult          Status of service:  In process, will continue to follow  Per UR Regulation:  Reviewed for med. necessity/level of care/duration of stay  Comments:  4/21/2016Abelino Derrick- B Ebrima Ranta RN,BSN,MHA [phone]

## 2015-01-16 NOTE — Evaluation (Addendum)
Physical Therapy Evaluation Patient Details Name: Olivia Lin MRN: [id number] DOB: 15-Oct-1955 Today's Date: 01/16/2015   History of Present Illness  Patient is a 59 y/o female with PMH of HTN and tobacco abuse, who presents with right leg weakness and numbness. Pt tripped and fell 2 days PTA and fell on right hip. 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     Clinical Impression  Patient presents with mild weakness RLE and balance deficits impacting mobility. Pt with elevated BP prior to start of PT session (stopped taking BP meds at home ~1 year ago). Tolerated ambulation with mild balance deficits putting pt at increased risk for falls. Might benefit from [facility] next session? PTA, pt independent and very active. Would benefit from OPPT to improve strength, mobility, gait, and balance so pt can maximize independence and return to PLOF.  BP sitting 153/109 BP standing 155/97   Follow Up Recommendations Outpatient PT;Supervision/Assistance - 24 hour    Equipment Recommendations  None recommended by PT    Recommendations for Other Services       Precautions / Restrictions Precautions Precautions: Fall Precaution Comments: elevated BP Restrictions Weight Bearing Restrictions: No      Mobility  Bed Mobility Overal bed mobility: Modified Independent                Transfers Overall transfer level: Needs assistance Equipment used: None Transfers: Sit to/from Stand Sit to Stand: Min guard         General transfer comment: Min guard for safety. Mildly unsteady upon standing.  Ambulation/Gait Ambulation/Gait assistance: Min guard Ambulation Distance (Feet): 150 Feet Assistive device: None Gait Pattern/deviations: Step-through pattern;Decreased stride length;Narrow base of support;Drifts right/left   Gait velocity interpretation: Below normal speed for age/gender General Gait Details: Pt with mildly  unsteady gait with drifting noted esp during head turns. No LOB. Decreased foot clearance RLE when fatigued.  Stairs Stairs: Yes Stairs assistance: Min guard Stair Management: One rail Right;Step to pattern Number of Stairs: 3 (+ 2 step x2 bouts.) General stair comments: Cues for technique and safety.  Wheelchair Mobility    Modified Rankin (Stroke Patients Only) Modified Rankin (Stroke Patients Only) Pre-Morbid Rankin Score: No symptoms Modified Rankin: Moderately severe disability     Balance Overall balance assessment: Needs assistance;History of Falls Sitting-balance support: Feet supported;No upper extremity supported Sitting balance-Leahy Scale: Good Sitting balance - Comments: Able to reach outside BoS and donn socks without difficulty or LOB.   Standing balance support: During functional activity Standing balance-Leahy Scale: Fair               High level balance activites: Direction changes;Turns;Sudden stops;Head turns High Level Balance Comments: Tolerated above balance challenges with mild deviations in gait but no LOB.             Pertinent Vitals/Pain Pain Assessment: Faces Faces Pain Scale: Hurts little more Pain Location: headache Pain Descriptors / Indicators: Aching;Headache Pain Intervention(s): Monitored during session;Premedicated before session;Repositioned    Home Living Family/patient expects to be discharged to:: Private residence Living Arrangements: Spouse/significant other Available Help at Discharge: Family;Available PRN/intermittently Type of Home: House Home Access: Stairs to enter Entrance Stairs-Rails: Right Entrance Stairs-Number of Steps: 3 Home Layout: Laundry or work area in basement;Two level Home Equipment: None      Prior Function Level of Independence: Independent         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     Hand  Dominance        Extremity/Trunk Assessment   Upper Extremity Assessment:  Defer to OT evaluation           Lower Extremity Assessment: RLE deficits/detail RLE Deficits / Details: Grossly ~3/5 DF/PF, ~3/5 knee extension.     Cervical / Trunk Assessment: Normal  Communication   Communication: No difficulties  Cognition Arousal/Alertness: Awake/alert Behavior During Therapy: WFL for tasks assessed/performed Overall Cognitive Status: Within Functional Limits for tasks assessed                      General Comments      Exercises        Assessment/Plan    PT Assessment Patient needs continued PT services  PT Diagnosis Difficulty walking   PT Problem List Decreased strength;Pain;Impaired sensation;Decreased balance;Decreased mobility  PT Treatment Interventions Balance training;Gait training;Neuromuscular re-education;Stair training;Functional mobility training;Patient/family education;Therapeutic activities;Therapeutic exercise   PT Goals (Current goals can be found in the Care Plan section) Acute Rehab PT Goals Patient Stated Goal: to regain independence PT Goal Formulation: With patient Time For Goal Achievement: 01/30/15 Potential to Achieve Goals: Good    Frequency Min 4X/week   Barriers to discharge Decreased caregiver support Pt's spouse works during the day.    Co-evaluation               End of Session Equipment Utilized During Treatment: Gait belt Activity Tolerance: Patient tolerated treatment well Patient left: in chair;with call bell/phone within reach;with chair alarm set Nurse Communication: Mobility status         Time: [id number] PT Time Calculation (min) (ACUTE ONLY): 27 min   Charges:   PT Evaluation $Initial PT Evaluation Tier I: 1 Procedure PT Treatments $Gait Training: 8-22 mins   PT G CodesAlvie Heidelberg:        [name], Olivia Lin

## 2015-01-16 NOTE — Progress Notes (Signed)
PT Cancellation Note  Patient Details Name: Olivia DestineJamie Malanowski MRN: [id number] DOB: Feb 13, 1956   Cancelled Treatment:    Reason Eval/Treat Not Completed: Medical issues which prohibited therapy Holding PT evaluation as pt on active bedrest orders. Will await increase in activity orders prior to initiation of PT eval.   [name], [name] A 01/16/2015, 9:05 AM  Alvie HeidelbergShauna [name], PT, DPT [phone]

## 2015-01-16 NOTE — Progress Notes (Signed)
STROKE TEAM PROGRESS NOTE   HISTORY Olivia Lin is an 59 y.o. female hx of HTN, chronic smoker presenting to ED with right sided weakness x 2 days. Notes Monday, tripped and injured her right foot. Reports falling on her right hip during this episode. Denies any lumbar back pain. Woke up following morning, noted heaviness and weakness in RLE. Difficulty standing due to weakness. Notes numbness in RLE from knee down. Went to [facility] ED for further evaluation. There, noted to have both RUE and RLE weakness, concern for CVA so transferred to [facility] for further evaluation. CT head imaging reviewed and overall unremarkable. Of note, BP 190/111 upon arrival to ED. She was last known well 01/13/2015, time unknown. Patient was not administered TPA secondary to being outside the window. She was admitted for further evaluation and treatment.   SUBJECTIVE (INTERVAL HISTORY) No family is at the bedside.  Overall she feels her condition is stable. She agrees to quit smoking   OBJECTIVE Temp:  [97.5 F (36.4 C)-98.6 F (37 C)] 97.7 F (36.5 C) (04/21 1024) Pulse Rate:  [64-98] 66 (04/21 1024) Cardiac Rhythm:  [-]  Resp:  [16-20] 16 (04/21 1024) BP: (141-193)/(72-99) 171/91 mmHg (04/21 1024) SpO2:  [94 %-99 %] 95 % (04/21 0810) Weight:  [47.174 kg (104 lb)] 47.174 kg (104 lb) (04/20 2222)  No results for input(s): GLUCAP in the last 168 hours.  Recent Labs Lab 01/15/15 1355  NA 135  K 3.9  CL 99  CO2 27  GLUCOSE 104*  BUN 7  CREATININE 0.72  CALCIUM 9.5    Recent Labs Lab 01/15/15 1355  AST 28  ALT 17  ALKPHOS 67  BILITOT 0.6  PROT 7.5  ALBUMIN 4.3    Recent Labs Lab 01/15/15 1355  WBC 6.7  NEUTROABS 3.5  HGB 15.3*  HCT 43.8  MCV 87.1  PLT 252    Recent Labs Lab 01/15/15 1355  TROPONINI <0.03    Recent Labs  01/15/15 1355  LABPROT 12.5  INR 0.93    Recent Labs  01/16/15 0211  COLORURINE YELLOW  LABSPEC 1.010  PHURINE 5.5  GLUCOSEU NEGATIVE  HGBUR  NEGATIVE  BILIRUBINUR NEGATIVE  KETONESUR NEGATIVE  PROTEINUR NEGATIVE  UROBILINOGEN 0.2  NITRITE NEGATIVE  LEUKOCYTESUR NEGATIVE       Component Value Date/Time   CHOL 163 01/16/2015 0255   TRIG 53 01/16/2015 0255   HDL 73 01/16/2015 0255   CHOLHDL 2.2 01/16/2015 0255   VLDL 11 01/16/2015 0255   LDLCALC 79 01/16/2015 0255   No results found for: HGBA1C    Component Value Date/Time   LABOPIA NONE DETECTED 01/16/2015 0210   COCAINSCRNUR NONE DETECTED 01/16/2015 0210   LABBENZ NONE DETECTED 01/16/2015 0210   AMPHETMU NONE DETECTED 01/16/2015 0210   THCU NONE DETECTED 01/16/2015 0210   LABBARB NONE DETECTED 01/16/2015 0210     Recent Labs Lab 01/15/15 1355  ETH <5    Ct Head Wo Contrast  01/15/2015   CLINICAL DATA:  Tripped and fell today, dizziness, denies hitting head, initial encounter  EXAM: CT HEAD WITHOUT CONTRAST  TECHNIQUE: Contiguous axial images were obtained from the base of the skull through the vertex without intravenous contrast.  COMPARISON:  None  FINDINGS: Normal ventricular morphology.  No midline shift or mass effect.  Normal appearance of brain parenchyma.  No intracranial hemorrhage, mass lesion, or acute infarction.  Visualized paranasal sinuses and mastoid air cells clear.  Bones unremarkable.  IMPRESSION: Normal exam.   Electronically  Signed   By: Ulyses SouthwardMark  Boles M.D.   On: 01/15/2015 14:12   Mr Brain Wo Contrast  01/15/2015   CLINICAL DATA:  Fall. Right-sided weakness for 2 days. Right lower extremity numbness.  EXAM: MRI HEAD WITHOUT CONTRAST  TECHNIQUE: Multiplanar, multiecho pulse sequences of the brain and surrounding structures were obtained without intravenous contrast.  COMPARISON:  Head CT earlier today  FINDINGS: Limited visualization of the upper cervical spine demonstrates disc bulging at C4-5 with mild impression on the spinal cord.  There are numerous small, patchy areas of acute cortical and subcortical infarction predominantly located in the  parasagittal regions of the left greater than right parietal lobes and left frontal lobe. There is no evidence of intracranial hemorrhage, mass, midline shift, or extra-axial fluid collection. There is mild generalized cerebral atrophy. Patchy T2 hyperintensities in the subcortical and deep cerebral white matter separate from the areas of acute infarction are nonspecific but compatible with mild chronic small vessel ischemic disease, mildly advanced for age. There is a chronic lacunar infarct in the left thalamus.  Orbits are unremarkable. Paranasal sinuses are clear. There small bilateral mastoid effusions. The distal right vertebral artery is not well seen and may be congenitally hypoplastic or stenotic/ occluded. Other major intracranial vascular flow voids are preserved.  IMPRESSION: 1. 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 2. Mild chronic small vessel ischemic disease. 3. Hypoplastic versus stenotic or occluded distal right vertebral artery.   Electronically Signed   By: Sebastian AcheAllen  Grady   On: 01/15/2015 20:11   Mr Lumbar Spine Wo Contrast  01/15/2015   CLINICAL DATA:  Right-sided weakness for 2 days.  EXAM: MRI LUMBAR SPINE WITHOUT CONTRAST  TECHNIQUE: Multiplanar, multisequence MR imaging of the lumbar spine was performed. No intravenous contrast was administered.  COMPARISON:  Lumbar spine radiographs 01/08/2010  FINDINGS: Five lumbar type vertebral bodies. There is slight left convex curvature of the lumbar spine. Trace retrolisthesis is noted of L2 on L3. There is disc desiccation and moderate disc space narrowing throughout the lumbar spine with multilevel degenerative endplate changes including mild edema. Vertebral body heights are preserved. The lumbar spinal canal is mildly narrow diffusely on a congenital basis due to short pedicles. The conus medullaris is normal in signal and terminates at the superior aspect of L1. Paraspinal soft tissues  are unremarkable.  T12-L1:  Mild disc bulging without stenosis.  L1-2: Mild circumferential disc bulging with small superimposed central disc extrusion and minimal facet hypertrophy. These changes along with congenitally short pedicles contribute to mild spinal stenosis and mild right neural foraminal stenosis.  L2-3: Moderate circumferential disc bulging, mild facet hypertrophy, and congenitally short pedicles result in mild-to-moderate spinal stenosis, moderate bilateral lateral recess stenosis, and mild bilateral neural foraminal stenosis.  L3-4: Mild-to-moderate circumferential disc bulging, mild facet hypertrophy, and congenitally short pedicles result in moderate spinal stenosis, moderate bilateral lateral recess stenosis, and mild bilateral neural foraminal stenosis.  L4-5: Mild-to-moderate circumferential disc bulging, mild facet hypertrophy, and congenitally short pedicles result in moderate spinal stenosis, moderate right and severe left lateral recess stenosis, and moderate bilateral neural foraminal stenosis.  L5-S1: Mild disc bulging and mild facet hypertrophy result in mild left greater than right lateral recess stenosis and mild bilateral neural foraminal stenosis. No spinal stenosis.  IMPRESSION: Moderate diffuse lumbar disc degeneration superimposed on a congenitally narrow lumbar spinal canal, worst at L4-5 where spinal stenosis is moderate.   Electronically Signed   By: Sebastian AcheAllen  Grady  On: 01/15/2015 20:05     PHYSICAL EXAM Frail middle aged Caucasian lady not in distress. . Afebrile. Head is nontraumatic. Neck is supple without bruit.    Cardiac exam no murmur or gallop. Lungs are clear to auscultation. Distal pulses are well felt. Neurological Exam ;  Awake  Alert oriented x 3. Normal speech and language.eye movements full without nystagmus.fundi were not visualized. Vision acuity and fields appear normal. Hearing is normal. Palatal movements are normal. Face asymmetric with right lower  face weakness. Tongue midline. Normal strength, tone, reflexes and coordination except mild right grip and intrinsic hand weakness an dmild 4/5 right hip flexor weakness.Marland Kitchenorbits left over right upper extremity.. Normal sensation. Gait deferred. ASSESSMENT/PLAN Olivia Lin is a 58 y.o. female with history of hypertension and chronic smoking presenting with right sided weakness x 2 days. She did not receive IV t-PA due to delay in arrival.   Stroke:  bilateral watershed infarcts concerning for bilateral ICA stenosis.  Workup underway  Resultant  R hemiparesis  MRI  Bilateral watershed infarcts  CT angio head and neck ordered  Cancel Carotid Doppler   2D Echo  pending   HgbA1c pending  Heparin 5000 units sq tid for VTE prophylaxis Diet Heart Room service appropriate?: Yes; Fluid consistency:: Thin  no antithrombotic prior to admission, now on aspirin 325 mg orally every day. As large vessel stenosis suspected, will go ahead and placed on both aspirin and plavix. Plans are for treatment x 3 months, then one alone. Will confirm future plan post testing.  Ongoing aggressive stroke risk factor management  Therapy recommendations:  OP PT  Disposition:  Anticipate return home with OP therapies  Hypertension, accelerated  BP 190/111  Permissive hypertension (OK if < 220/120), especially in the setting of presumed carotid disease  Hyperlipidemia  Home meds:  No statin  LDL 79, goal < 70  Placed on 40 mg lipitor  Continue statin at discharge  Other Stroke Risk Factors  Cigarette smoker, advised to stop smoking, has nicotine patch currently  ETOH use  Hx Migraines following cervial injeyury. Last one 7 years ago. Will give ultra 50 q 6h prn  Hospital day # 1  Rhoderick Moody [facility] See Amion for Pager information 01/16/2015 3:36 PM   I have personally examined this patient, reviewed notes, independently viewed imaging studies, participated in  medical decision making and plan of care. I have made any additions or clarifications directly to the above note. Agree with note above. She presented with right leg weakness and MRI shows bilateral watershed infarcts but vascular imaging is pending. She remains at risk for neurological worsening, recurrent strokes, TIA and needs ongoing stroke evaluation and aggressive risk factor control. She was counselled to quit smoking and be compliant with her medicines and keep medical f/u.  Delia Heady, MD Medical Director [facility] Pager: [phone] 01/16/2015 6:17 PM   To contact Stroke Continuity provider, please refer to [email or web address]. After hours, contact General Neurology

## 2015-01-16 NOTE — Progress Notes (Signed)
PATIENT DETAILS Name: Olivia DestineJamie Eubanks Age: 59 y.o. Sex: female Date of Birth: 1956-05-29 Admit Date: 01/15/2015 Admitting Physician Lorretta HarpXilin Niu, MD ZOX:WRUEA,VWUJWJPCP:HAWKS,ALDENE N, MD  Subjective: 59 yo F with a PMH of HTN and tobacco abuse  who presents with weakness and numbness of her R leg for 2 days. 2 days ago she tripped and fell and got a small cut to the bottom of her right foot and landed on her right hip. Pt has not been taking her htn medication for a number of years because she did not like the side effects of them.   Assessment/Plan: Principal Problem:   Stroke Active Problems:   Right hemiparesis   Hypertension   Tobacco abuse   Acute CVA: Presenting with R leg weakness and numbness, no other neurological symptoms. Has not taken HTN meds in number of years. Symptoms have since resolved while admitted.  CT head negative. MRI Brain shows acute parasagittal infarcts in the L frontal and bilateral parietal lobes.  Suspicion for embolic CVA from either carotid disease or cardiac etiology.Await Echo and CT Angio neck.LDL at 79 and not at goal-now on Lipitor. A1c pending. Continue ASA.   Dyslipidemia:LDL at 79 and not at goal-now on Lipitor. Will need repeat Lipid panel in 3-6 months.  XBJ:YNWGNHTN:allow permissive HTN. Will need anti-hypertensives on discharge.   Tobacco abuse:Nicoderm patch provided. Counseled.  Chronic Back Pain:MRI Lumbar shows moderate diffuse lumbar disc degeneration superimposed on a congenitally narrow lumbar spinal  canal, worst at L4-5 where stenosis is moderate. Supportive care  Small wound on plantar surface of right foot: no signs of infection-claims this was following trauma-tetanus booster  Disposition: Home once work up complete  Antimicrobial agents  See below  Anti-infectives    None      DVT Prophylaxis: SQ Heparin  Code Status: Full code  Family Communication none  Procedures: None  CONSULTS:  neurology, PT,  OT   MEDICATIONS: Scheduled Meds: . aspirin  300 mg Rectal Daily   Or  . aspirin  325 mg Oral Daily  . atorvastatin  40 mg Oral q1800  . heparin  5,000 Units Subcutaneous 3 times per day  . nicotine  21 mg Transdermal Daily  . [START ON 01/17/2015] pneumococcal 23 valent vaccine  0.5 mL Intramuscular Tomorrow-1000   Continuous Infusions:  PRN Meds:.senna-docusate   PHYSICAL EXAM: Vital signs in last 24 hours: Filed Vitals:   01/16/15 0200 01/16/15 0400 01/16/15 0602 01/16/15 0810  BP: 146/88 167/83 141/85 155/91  Pulse: 67 64 74 98  Temp: 97.9 F (36.6 C) 97.9 F (36.6 C) 97.9 F (36.6 C) 98.6 F (37 C)  TempSrc: Oral Oral Oral Oral  Resp: 20 18 18 18   Height:      Weight:      SpO2: 96% 97% 96% 95%    Weight change:  Filed Weights   01/15/15 1044 01/15/15 2222  Weight: 52.164 kg (115 lb) 47.174 kg (104 lb)   Body mass index is 20.31 kg/(m^2).   Gen Exam: Awake and alert with clear speech.   Neck: Supple, No JVD.   Chest: B/L Clear.   CVS: S1 S2 Regular, no murmurs.  Abdomen: soft, BS +, non tender, non distended.  Extremities: no edema, lower extremities warm to touch. Neurologic: Non Focal.   Skin: No Rash.  Wounds: Small laceration to plantar surface of R foot.    Intake/Output from previous day: No intake  or output data in the 24 hours ending 01/16/15 0934   LAB RESULTS: CBC  Recent Labs Lab 01/15/15 1355  WBC 6.7  HGB 15.3*  HCT 43.8  PLT 252  MCV 87.1  MCH 30.4  MCHC 34.9  RDW 13.3  LYMPHSABS 2.3  MONOABS 0.8  EOSABS 0.1  BASOSABS 0.0    Chemistries   Recent Labs Lab 01/15/15 1355  NA 135  K 3.9  CL 99  CO2 27  GLUCOSE 104*  BUN 7  CREATININE 0.72  CALCIUM 9.5    CBG: No results for input(s): GLUCAP in the last 168 hours.  GFR Estimated Creatinine Clearance: 55.7 mL/min (by C-G formula based on Cr of 0.72).  Coagulation profile  Recent Labs Lab 01/15/15 1355  INR 0.93    Cardiac Enzymes  Recent Labs Lab  01/15/15 1355  TROPONINI <0.03    Invalid input(s): POCBNP No results for input(s): DDIMER in the last 72 hours. No results for input(s): HGBA1C in the last 72 hours.  Recent Labs  01/16/15 0255  CHOL 163  HDL 73  LDLCALC 79  TRIG 53  CHOLHDL 2.2   No results for input(s): TSH, T4TOTAL, T3FREE, THYROIDAB in the last 72 hours.  Invalid input(s): FREET3 No results for input(s): VITAMINB12, FOLATE, FERRITIN, TIBC, IRON, RETICCTPCT in the last 72 hours. No results for input(s): LIPASE, AMYLASE in the last 72 hours.  Urine Studies No results for input(s): UHGB, CRYS in the last 72 hours.  Invalid input(s): UACOL, UAPR, USPG, UPH, UTP, UGL, UKET, UBIL, UNIT, UROB, ULEU, UEPI, UWBC, URBC, UBAC, CAST, UCOM, BILUA  MICROBIOLOGY: No results found for this or any previous visit (from the past 240 hour(s)).  RADIOLOGY STUDIES/RESULTS: Ct Head Wo Lin  01/15/2015   CLINICAL DATA:  Tripped and fell today, dizziness, denies hitting head, initial encounter  EXAM: CT HEAD WITHOUT Lin  TECHNIQUE: Contiguous axial images were obtained from the base of the skull through the vertex without intravenous Lin.  COMPARISON:  None  FINDINGS: Normal ventricular morphology.  No midline shift or mass effect.  Normal appearance of brain parenchyma.  No intracranial hemorrhage, mass lesion, or acute infarction.  Visualized paranasal sinuses and mastoid air cells clear.  Bones unremarkable.  IMPRESSION: Normal exam.   Electronically Signed   By: Ulyses Southward M.D.   On: 01/15/2015 14:12   Olivia Lin  01/15/2015   CLINICAL DATA:  Fall. Right-sided weakness for 2 days. Right lower extremity numbness.  EXAM: MRI HEAD WITHOUT Lin  TECHNIQUE: Multiplanar, multiecho pulse sequences of the brain and surrounding structures were obtained without intravenous Lin.  COMPARISON:  Head CT earlier today  FINDINGS: Limited visualization of the upper cervical spine demonstrates disc bulging at  C4-5 with mild impression on the spinal cord.  There are numerous small, patchy areas of acute cortical and subcortical infarction predominantly located in the parasagittal regions of the left greater than right parietal lobes and left frontal lobe. There is no evidence of intracranial hemorrhage, mass, midline shift, or extra-axial fluid collection. There is mild generalized cerebral atrophy. Patchy T2 hyperintensities in the subcortical and deep cerebral white matter separate from the areas of acute infarction are nonspecific but compatible with mild chronic small vessel ischemic disease, mildly advanced for age. There is a chronic lacunar infarct in the left thalamus.  Orbits are unremarkable. Paranasal sinuses are clear. There small bilateral mastoid effusions. The distal right vertebral artery is not well seen and may be congenitally hypoplastic or  stenotic/ occluded. Other major intracranial vascular flow voids are preserved.  IMPRESSION: 1. 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 2. Mild chronic small vessel ischemic disease. 3. Hypoplastic versus stenotic or occluded distal right vertebral artery.   Electronically Signed   By: Sebastian Ache   On: 01/15/2015 20:11   Olivia Lin  01/15/2015   CLINICAL DATA:  Right-sided weakness for 2 days.  EXAM: MRI LUMBAR SPINE WITHOUT Lin  TECHNIQUE: Multiplanar, multisequence Olivia imaging of the lumbar spine was performed. No intravenous Lin was administered.  COMPARISON:  Lumbar spine radiographs 01/08/2010  FINDINGS: Five lumbar type vertebral bodies. There is slight left convex curvature of the lumbar spine. Trace retrolisthesis is noted of L2 on L3. There is disc desiccation and moderate disc space narrowing throughout the lumbar spine with multilevel degenerative endplate changes including mild edema. Vertebral body heights are preserved. The lumbar spinal canal is mildly narrow  diffusely on a congenital basis due to short pedicles. The conus medullaris is normal in signal and terminates at the superior aspect of L1. Paraspinal soft tissues are unremarkable.  T12-L1:  Mild disc bulging without stenosis.  L1-2: Mild circumferential disc bulging with small superimposed central disc extrusion and minimal facet hypertrophy. These changes along with congenitally short pedicles contribute to mild spinal stenosis and mild right neural foraminal stenosis.  L2-3: Moderate circumferential disc bulging, mild facet hypertrophy, and congenitally short pedicles result in mild-to-moderate spinal stenosis, moderate bilateral lateral recess stenosis, and mild bilateral neural foraminal stenosis.  L3-4: Mild-to-moderate circumferential disc bulging, mild facet hypertrophy, and congenitally short pedicles result in moderate spinal stenosis, moderate bilateral lateral recess stenosis, and mild bilateral neural foraminal stenosis.  L4-5: Mild-to-moderate circumferential disc bulging, mild facet hypertrophy, and congenitally short pedicles result in moderate spinal stenosis, moderate right and severe left lateral recess stenosis, and moderate bilateral neural foraminal stenosis.  L5-S1: Mild disc bulging and mild facet hypertrophy result in mild left greater than right lateral recess stenosis and mild bilateral neural foraminal stenosis. No spinal stenosis.  IMPRESSION: Moderate diffuse lumbar disc degeneration superimposed on a congenitally narrow lumbar spinal canal, worst at L4-5 where spinal stenosis is moderate.   Electronically Signed   By: Sebastian Ache   On: 01/15/2015 20:05    Minda Ditto, PA-S Jeoffrey Massed, MD [facility] Pager:[phone]  If 7PM-7AM, please contact night-coverage [email or web address] Password [facility] 01/16/2015, 9:34 AM   LOS: 1 day   Attending Patient was seen, examined,treatment plan was discussed with PA-S.  I have directly reviewed the clinical findings, lab,  imaging studies and management of this patient in detail. I have made the necessary changes to the above noted documentation, and agree with the documentation  59 yo female-non compliant to meds-hx of HTN-ongoing tobacco use-presented with right leg weakness/numbness-which has now resolved. MRI positive for b/l acute infarcts-awaiting work up-including CT Angio, Echo. LDL not at goal-started on Statins. Rest as above.  Windell Norfolk MD [facility].

## 2015-01-17 DIAGNOSIS — I6789 Other cerebrovascular disease: Secondary | ICD-10-CM

## 2015-01-17 LAB — HEMOGLOBIN A1C
Hgb A1c MFr Bld: 6 % — ABNORMAL HIGH (ref 4.8–5.6)
MEAN PLASMA GLUCOSE: 126 mg/dL

## 2015-01-17 MED ORDER — CLOPIDOGREL BISULFATE 75 MG PO TABS: 75.0000 mg | ORAL_TABLET | Freq: Every day | ORAL | Status: AC

## 2015-01-17 MED ORDER — NICOTINE 21 MG/24HR TD PT24: 21.0000 mg | MEDICATED_PATCH | Freq: Every day | TRANSDERMAL | Status: AC

## 2015-01-17 MED ORDER — ASPIRIN 81 MG PO TBEC: 81.0000 mg | DELAYED_RELEASE_TABLET | Freq: Every day | ORAL | Status: AC

## 2015-01-17 MED ORDER — ATORVASTATIN CALCIUM 40 MG PO TABS: 40.0000 mg | ORAL_TABLET | Freq: Every day | ORAL | Status: AC

## 2015-01-17 MED ORDER — LISINOPRIL 10 MG PO TABS: 10.0000 mg | ORAL_TABLET | Freq: Every day | ORAL | Status: AC

## 2015-01-17 MED ORDER — LISINOPRIL 10 MG PO TABS
10.0000 mg | ORAL_TABLET | Freq: Every day | ORAL | Status: DC
  Administered 2015-01-17: 10 mg via ORAL
  Filled 2015-01-17: qty 1

## 2015-01-17 MED ORDER — TRAMADOL HCL 50 MG PO TABS: 50.0000 mg | ORAL_TABLET | Freq: Four times a day (QID) | ORAL | Status: AC | PRN

## 2015-01-17 NOTE — Evaluation (Signed)
Occupational Therapy Evaluation Patient Details Name: Olivia Lin MRN: [id number] DOB: Aug 28, 1956 Today's Date: 01/17/2015    History of Present Illness Patient is a 59 y/o female with PMH of HTN and tobacco abuse, who presents with right leg weakness and numbness. Pt tripped and fell 2 days PTA and fell on right hip. 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    Clinical Impression   Patient evaluated by Occupational Therapy with no further acute OT needs identified. All education has been completed and the patient has no further questions. Pt demonstrates some R LE weakness with foot drop being addressed by PT and referral for outpatient PT to address balance. Pt with not immediate acute OT needs. See below for any follow-up Occupational Therapy or equipment needs. OT to sign off. Thank you for referral.      Follow Up Recommendations  No OT follow up    Equipment Recommendations  None recommended by OT    Recommendations for Other Services       Precautions / Restrictions Precautions Precautions: Fall Restrictions Weight Bearing Restrictions: No      Mobility Bed Mobility               General bed mobility comments: in chair on arrival  Transfers Overall transfer level: Needs assistance Equipment used: None Transfers: Sit to/from Stand Sit to Stand: Modified independent (Device/Increase time)              Balance                                 Standardized Balance Assessment Standardized Balance Assessment : Berg Balance Test;Dynamic Gait Index Berg Balance Test Sit to Stand: Able to stand without using hands and stabilize independently Standing Unsupported: Able to stand safely 2 minutes Dynamic Gait Index Level Surface: Mild Impairment Change in Gait Speed: Mild Impairment Gait with Vertical Head Turns: Mild Impairment      ADL Overall ADL's : Modified  independent                                       General ADL Comments: Pt educated to have family present for shower transfer the first x3 attempts. pt reading menu and scanning without deficits. pt reading and educated on signs / symptoms of stroke & warning signs. Pt will have family (A) upon d/c per patient. Pt with all education complete     Vision Vision Assessment?: No apparent visual deficits   Perception     Praxis      Pertinent Vitals/Pain Pain Assessment: No/denies pain     Hand Dominance Right   Extremity/Trunk Assessment Upper Extremity Assessment Upper Extremity Assessment: Overall WFL for tasks assessed   Lower Extremity Assessment Lower Extremity Assessment: Defer to PT evaluation RLE Deficits / Details: noted to have foot droop with decr clearance of toes with ambulation during OT assessment   Cervical / Trunk Assessment Cervical / Trunk Assessment: Normal   Communication Communication Communication: No difficulties   Cognition Arousal/Alertness: Awake/alert Behavior During Therapy: WFL for tasks assessed/performed Overall Cognitive Status: Within Functional Limits for tasks assessed                     General Comments       Exercises  Shoulder Instructions      Home Living Family/patient expects to be discharged to:: Private residence Living Arrangements: Spouse/significant other Available Help at Discharge: Family;Available PRN/intermittently Type of Home: House Home Access: Stairs to enter Entrance Stairs-Number of Steps: 3 Entrance Stairs-Rails: Right Home Layout: Laundry or work area in basement;Two level Alternate Level Stairs-Number of Steps: 1 flight Alternate Level Stairs-Rails: Right Bathroom Shower/Tub: Producer, television/film/videoWalk-in shower   Bathroom Toilet: Standard     Home Equipment: None          Prior Functioning/Environment Level of Independence: Independent        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    OT Diagnosis:     OT Problem List:     OT Treatment/Interventions:      OT Goals(Current goals can be found in the care plan section) Acute Rehab OT Goals Patient Stated Goal: to regain independence  OT Frequency:     Barriers to D/C:            Co-evaluation              End of Session Equipment Utilized During Treatment: Gait belt Nurse Communication: Mobility status;Precautions  Activity Tolerance: Patient tolerated treatment well Patient left: in chair;with call bell/phone within reach;with chair alarm set   Time: [id number] OT Time Calculation (min): 17 min Charges:  OT General Charges $OT Visit: 1 Procedure OT Evaluation $Initial OT Evaluation Tier I: 1 Procedure G-Codes:    Harolyn RutherfordJones, Skyeler Scalese B 01/17/2015, 9:23 AM  Pager: [phone]

## 2015-01-17 NOTE — Progress Notes (Signed)
Echocardiogram 2D Echocardiogram has been performed.  Dorothey BasemanReel, Laketia Vicknair M 01/17/2015, 9:03 AM

## 2015-01-17 NOTE — Progress Notes (Signed)
Talked to patient about Outpatient PT, patient requested [facility]; orders, clinical information faxed as requested; The rehab center will contact the patient at home for a start up date and time for therapy; Alexis GoodellB Jahi Roza RN,BSN,MHA [phone]

## 2015-01-17 NOTE — Evaluation (Signed)
Speech Language Pathology Evaluation Patient Details Name: Alba DestineJamie Wachob MRN: [id number] DOB: [birth year] Today's Date: 01/17/2015 Time: [id number] SLP Time Calculation (min) (ACUTE ONLY): 24 min  Problem List:  Patient Active Problem List   Diagnosis Date Noted  . Essential hypertension   . Dyslipidemia   . CVA (cerebral infarction) 01/15/2015  . Stroke 01/15/2015  . Right hemiparesis   . Hypertension   . Tobacco abuse    Past Medical History:  Past Medical History  Diagnosis Date  . Hypertension   . Tobacco abuse   . Asthma    Past Surgical History:  Past Surgical History  Procedure Laterality Date  . Neck surgery    . Tonsillectomy     HPI:  Patient is a 59 y/o female with PMH of HTN and tobacco abuse, who presents with right leg weakness and numbness. Pt tripped and fell 2 days PTA and fell on right hip. 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    Assessment / Plan / Recommendation Clinical Impression  Pt has mild impairments with selective attention which impacts storage of new information, with good emergent awareness as evidenced by verbalization of difficulties and requests for repetition. Pt and daughter both report that this is baseline for the patient; therefore, no SLP needs identified. Will sign off.    SLP Assessment  Patient does not need any further Speech Lanaguage Pathology Services    Follow Up Recommendations  None;Other (comment) (intermittent supervision upon initial return home)    Pertinent Vitals/Pain Pain Assessment: No/denies pain   SLP Goals  Patient/Family Stated Goal: none stated  SLP Evaluation Prior Functioning  Cognitive/Linguistic Baseline: Baseline deficits Baseline deficit details: pt and daughter describe "undiagnosed ADD" - says she has always had trouble focusing Type of Home: House  Lives With: Spouse Available Help at Discharge: Family;Available  PRN/intermittently Vocation: Full time employment   Cognition  Overall Cognitive Status: History of cognitive impairments - at baseline Orientation Level: Oriented X4    Comprehension  Auditory Comprehension Overall Auditory Comprehension: Appears within functional limits for tasks assessed Visual Recognition/Discrimination Discrimination: Within Function Limits    Expression Expression Primary Mode of Expression: Verbal Verbal Expression Overall Verbal Expression: Appears within functional limits for tasks assessed Written Expression Dominant Hand: Right Written Expression: Not tested   Oral / Motor Oral Motor/Sensory Function Overall Oral Motor/Sensory Function: Appears within functional limits for tasks assessed Motor Speech Overall Motor Speech: Appears within functional limits for tasks assessed    Maxcine HamLaura Paiewonsky, M.A. CCC-SLP [phone]  Maxcine Hamaiewonsky, Jatavis Malek 01/17/2015, 12:26 PM

## 2015-01-17 NOTE — Discharge Summary (Addendum)
PATIENT DETAILS Name: Olivia Lin Age: 59 y.o. Sex: female Date of Birth: October 15, 1955 MRN: [id number]. Admitting Physician: Lorretta Harp, MD QIH:[id number],ZDGLOV N, MD  Admit Date: 01/15/2015 Discharge date: 01/17/2015  Recommendations for Outpatient Follow-up:  1. New medications: Aspirin, Plavix, statin and lisinopril 2. Repeat lipid panel in the next 2-6 months 3. Needs ongoing counseling regarding importance of tobacco cessation   PRIMARY DISCHARGE DIAGNOSIS:  Principal Problem:   Stroke Active Problems:   Right hemiparesis   Hypertension   Tobacco abuse   Essential hypertension   Dyslipidemia      PAST MEDICAL HISTORY: Past Medical History  Diagnosis Date  . Hypertension   . Tobacco abuse   . Asthma     DISCHARGE MEDICATIONS: Current Discharge Medication List    START taking these medications   Details  aspirin EC 81 MG EC tablet Take 1 tablet (81 mg total) by mouth daily. Qty: 30 tablet, Refills: 0    atorvastatin (LIPITOR) 40 MG tablet Take 1 tablet (40 mg total) by mouth daily at 6 PM. Qty: 30 tablet, Refills: 0    clopidogrel (PLAVIX) 75 MG tablet Take 1 tablet (75 mg total) by mouth daily. Qty: 30 tablet, Refills: 0    lisinopril (PRINIVIL,ZESTRIL) 10 MG tablet Take 1 tablet (10 mg total) by mouth daily. Qty: 30 tablet, Refills: 0    nicotine (NICODERM CQ - DOSED IN MG/24 HOURS) 21 mg/24hr patch Place 1 patch (21 mg total) onto the skin daily. Qty: 28 patch, Refills: 0    traMADol (ULTRAM) 50 MG tablet Take 1 tablet (50 mg total) by mouth every 6 (six) hours as needed for moderate pain or severe pain. Qty: 15 tablet, Refills: 0      STOP taking these medications     ibuprofen (ADVIL,MOTRIN) 200 MG tablet      metaxalone (SKELAXIN) 800 MG tablet         ALLERGIES:   Allergies  Allergen Reactions  . Penicillins Other (See Comments)    BRIEF HPI:  See H&P, Labs, Consult and Test reports for all details in brief, patient is a 59 year old  female with past medical history of hypertension-noncompliant to medications, ongoing tobacco abuse, who presented with right leg weakness and numbness. Patient was subsequent admitted for further evaluation and treatment   CONSULTATIONS:   neurology  PERTINENT RADIOLOGIC STUDIES: Ct Angio Head W/cm &/or Wo Cm  01/16/2015   ADDENDUM REPORT: 01/16/2015 19:54  ADDENDUM: Study discussed by telephone with Dr. Vedia Coffer On 01/16/2015 at 1950 hrs.   Electronically Signed   By: Odessa Fleming M.D.   On: 01/16/2015 19:54   01/16/2015   CLINICAL DATA:  59 year old female with right side weakness diagnosed with acute parasagittal bilateral cerebral infarcts on MRI. Initial encounter.  EXAM: CT ANGIOGRAPHY HEAD AND NECK  TECHNIQUE: Multidetector CT imaging of the head and neck was performed using the standard protocol during bolus administration of intravenous contrast. Multiplanar CT image reconstructions and MIPs were obtained to evaluate the vascular anatomy. Carotid stenosis measurements (when applicable) are obtained utilizing NASCET criteria, using the distal internal carotid diameter as the denominator.  CONTRAST:  OMNIPAQUE IOHEXOL 350 MG/ML SOLN  COMPARISON:  Brain MRI 01/15/2015.  Head CT 01/15/2015.  FINDINGS: CT HEAD  Brain: Small areas of abnormal hypodensity have developed in the superior frontal and parietal lobes corresponding to the diffusion abnormality on yesterday's MRI. No associated hemorrhage or mass effect.  Stable gray-white matter differentiation elsewhere. No ventriculomegaly. No acute intracranial hemorrhage  identified.  Calvarium and skull base: Stable.  Paranasal sinuses: Stable.  Orbits: Negative.  CTA NECK  Skeleton: Degenerative changes in the cervical spine maximal at C5-C6 and C6-C7. No acute osseous abnormality identified.  Other neck: No cervical lymphadenopathy.  No superior mediastinal lymphadenopathy. There are numerous scattered calcified granulomas in both upper lungs.  Negative  thyroid, larynx, pharynx, parapharyngeal spaces, retropharyngeal space, sublingual space, submandibular glands, and parotid glands.  Aortic arch: 3 vessel arch configuration with mild proximal arch atherosclerosis, however there is more moderate distal arch and proximal descending thoracic aorta soft and calcified plaque. Some areas of small ulcerated plaque are suspected (series 8, image 20).  Right carotid system: Normal right CCA origin. Normal right carotid bifurcation. Soft and calcified plaque in the medial right ICA bulb without stenosis.  Left carotid system: Negative left CCA origin. Soft plaque in the mid left CCA along the anterior wall. Plaque measures up to 2 mm in thickness, but does not result in hemodynamically significant stenosis (less than 50 % with respect to the distal vessel). This soft plaque abates at the left carotid bifurcation. There is calcified plaque involving the left ECA origin, but the left ICA origin and bulb are widely patent. Negative cervical left ICA.  Vertebral arteries:No proximal right subclavian artery stenosis. Nonvisualization of the right vertebral artery origin and V1 segment. There is reconstituted enhancement of the right vertebral artery in the V2 segment beginning at the C4-C5 level. It appears to be non dominant. There is intermittent poor enhancement of the right V2 segment, an the vessel is Re occluded at the C1 level.  Dominant appearing left vertebral artery 0 has a normal origin with no proximal left subclavian artery stenosis. Negative left vertebral artery to the skullbase.  CTA HEAD  Posterior circulation: Dominant left vertebral artery supplies the basilar. The non dominant right vertebral artery is diminutive and re- occludes at the skullbase as detailed above. There is probable retrograde enhancement of the right V4 segment and right PICA origin. Normal left PICA.  No basilar artery stenosis. SCA and PCA origins are within normal limits. Posterior  communicating arteries are diminutive or absent. Bilateral PCA branches are within normal limits.  Anterior circulation: Both ICA siphons are patent without stenosis or calcified plaque. Ophthalmic artery origins are within normal limits. Normal carotid termini. Normal MCA and ACA origins.  Anterior communicating artery is within normal limits. There is a dominant appearing median artery of the corpus callosum which occludes about 12 mm beyond its origin there are patent smaller A2 and distal bilateral ACA segments.  Right MCA branches are within normal limits. Left MCA branches are within normal limits.  Venous sinuses: Patent.  Anatomic variants: Non dominant right vertebral artery.  Delayed phase: No abnormal enhancement identified.  IMPRESSION: 1. 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 2. Prominent soft plaque in the left common carotid artery (see series 8, image 51) without carotid hemodynamically significant stenosis in the neck or at the skullbase. 3. Non dominant appearing right vertebral artery origin is occluded, and there is poor reconstitution of the vessel in the right V2 segment, such that it re- occludes just proximal to the skullbase. 4. Dominant left vertebral artery is normal and likely supplies the right V4 segment and right PICA in a retrograde fashion. 5. Expected evolution of the small cerebral infarcts  on CT. No hemorrhage or mass effect. 6. Sequelae of previous pulmonary granulomatous disease.  Electronically Signed: By: Odessa Fleming M.D. On: 01/16/2015 18:55   Ct Head Wo Contrast  01/15/2015   CLINICAL DATA:  Tripped and fell today, dizziness, denies hitting head, initial encounter  EXAM: CT HEAD WITHOUT CONTRAST  TECHNIQUE: Contiguous axial images were obtained from the base of the skull through the  vertex without intravenous contrast.  COMPARISON:  None  FINDINGS: Normal ventricular morphology.  No midline shift or mass effect.  Normal appearance of brain parenchyma.  No intracranial hemorrhage, mass lesion, or acute infarction.  Visualized paranasal sinuses and mastoid air cells clear.  Bones unremarkable.  IMPRESSION: Normal exam.   Electronically Signed   By: Ulyses Southward M.D.   On: 01/15/2015 14:12   Ct Angio Neck W/cm &/or Wo/cm  01/16/2015   ADDENDUM REPORT: 01/16/2015 19:54  ADDENDUM: Study discussed by telephone with Dr. Vedia Coffer On 01/16/2015 at 1950 hrs.   Electronically Signed   By: Odessa Fleming M.D.   On: 01/16/2015 19:54   01/16/2015   CLINICAL DATA:  59 year old female with right side weakness diagnosed with acute parasagittal bilateral cerebral infarcts on MRI. Initial encounter.  EXAM: CT ANGIOGRAPHY HEAD AND NECK  TECHNIQUE: Multidetector CT imaging of the head and neck was performed using the standard protocol during bolus administration of intravenous contrast. Multiplanar CT image reconstructions and MIPs were obtained to evaluate the vascular anatomy. Carotid stenosis measurements (when applicable) are obtained utilizing NASCET criteria, using the distal internal carotid diameter as the denominator.  CONTRAST:  OMNIPAQUE IOHEXOL 350 MG/ML SOLN  COMPARISON:  Brain MRI 01/15/2015.  Head CT 01/15/2015.  FINDINGS: CT HEAD  Brain: Small areas of abnormal hypodensity have developed in the superior frontal and parietal lobes corresponding to the diffusion abnormality on yesterday's MRI. No associated hemorrhage or mass effect.  Stable gray-white matter differentiation elsewhere. No ventriculomegaly. No acute intracranial hemorrhage identified.  Calvarium and skull base: Stable.  Paranasal sinuses: Stable.  Orbits: Negative.  CTA NECK  Skeleton: Degenerative changes in the cervical spine maximal at C5-C6 and C6-C7. No acute osseous abnormality identified.  Other neck: No cervical lymphadenopathy.   No superior mediastinal lymphadenopathy. There are numerous scattered calcified granulomas in both upper lungs.  Negative thyroid, larynx, pharynx, parapharyngeal spaces, retropharyngeal space, sublingual space, submandibular glands, and parotid glands.  Aortic arch: 3 vessel arch configuration with mild proximal arch atherosclerosis, however there is more moderate distal arch and proximal descending thoracic aorta soft and calcified plaque. Some areas of small ulcerated plaque are suspected (series 8, image 20).  Right carotid system: Normal right CCA origin. Normal right carotid bifurcation. Soft and calcified plaque in the medial right ICA bulb without stenosis.  Left carotid system: Negative left CCA origin. Soft plaque in the mid left CCA along the anterior wall. Plaque measures up to 2 mm in thickness, but does not result in hemodynamically significant stenosis (less than 50 % with respect to the distal vessel). This soft plaque abates at the left carotid bifurcation. There is calcified plaque involving the left ECA origin, but the left ICA origin and bulb are widely patent. Negative cervical left ICA.  Vertebral arteries:No proximal right subclavian artery stenosis. Nonvisualization of the right vertebral artery origin and V1 segment. There is reconstituted enhancement of the right vertebral artery in the V2 segment beginning at the C4-C5 level. It appears to be non dominant. There is intermittent poor enhancement of the right V2 segment,  an the vessel is Re occluded at the C1 level.  Dominant appearing left vertebral artery 0 has a normal origin with no proximal left subclavian artery stenosis. Negative left vertebral artery to the skullbase.  CTA HEAD  Posterior circulation: Dominant left vertebral artery supplies the basilar. The non dominant right vertebral artery is diminutive and re- occludes at the skullbase as detailed above. There is probable retrograde enhancement of the right V4 segment and right  PICA origin. Normal left PICA.  No basilar artery stenosis. SCA and PCA origins are within normal limits. Posterior communicating arteries are diminutive or absent. Bilateral PCA branches are within normal limits.  Anterior circulation: Both ICA siphons are patent without stenosis or calcified plaque. Ophthalmic artery origins are within normal limits. Normal carotid termini. Normal MCA and ACA origins.  Anterior communicating artery is within normal limits. There is a dominant appearing median artery of the corpus callosum which occludes about 12 mm beyond its origin there are patent smaller A2 and distal bilateral ACA segments.  Right MCA branches are within normal limits. Left MCA branches are within normal limits.  Venous sinuses: Patent.  Anatomic variants: Non dominant right vertebral artery.  Delayed phase: No abnormal enhancement identified.  IMPRESSION: 1. 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 2. Prominent soft plaque in the left common carotid artery (see series 8, image 51) without carotid hemodynamically significant stenosis in the neck or at the skullbase. 3. Non dominant appearing right vertebral artery origin is occluded, and there is poor reconstitution of the vessel in the right V2 segment, such that it re- occludes just proximal to the skullbase. 4. Dominant left vertebral artery is normal and likely supplies the right V4 segment and right PICA in a retrograde fashion. 5. Expected evolution of the small cerebral infarcts on CT. No hemorrhage or mass effect. 6. Sequelae of previous pulmonary granulomatous disease.  Electronically Signed: By: Odessa Fleming M.D. On: 01/16/2015 18:55   Mr Brain Wo Contrast  01/15/2015   CLINICAL DATA:  Fall. Right-sided weakness for 2 days. Right lower extremity numbness.  EXAM:  MRI HEAD WITHOUT CONTRAST  TECHNIQUE: Multiplanar, multiecho pulse sequences of the brain and surrounding structures were obtained without intravenous contrast.  COMPARISON:  Head CT earlier today  FINDINGS: Limited visualization of the upper cervical spine demonstrates disc bulging at C4-5 with mild impression on the spinal cord.  There are numerous small, patchy areas of acute cortical and subcortical infarction predominantly located in the parasagittal regions of the left greater than right parietal lobes and left frontal lobe. There is no evidence of intracranial hemorrhage, mass, midline shift, or extra-axial fluid collection. There is mild generalized cerebral atrophy. Patchy T2 hyperintensities in the subcortical and deep cerebral white matter separate from the areas of acute infarction are nonspecific but compatible with mild chronic small vessel ischemic disease, mildly advanced for age. There is a chronic lacunar infarct in the left thalamus.  Orbits are unremarkable. Paranasal sinuses are clear. There small bilateral mastoid effusions. The distal right vertebral artery is not well seen and may be congenitally hypoplastic or stenotic/ occluded. Other major intracranial vascular flow voids are preserved.  IMPRESSION: 1. 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 2. Mild chronic small vessel ischemic disease. 3. Hypoplastic versus stenotic or occluded distal right vertebral artery.  Electronically Signed   By: Sebastian AcheAllen  Grady   On: 01/15/2015 20:11   Mr Lumbar Spine Wo Contrast  01/15/2015   CLINICAL DATA:  Right-sided weakness for 2 days.  EXAM: MRI LUMBAR SPINE WITHOUT CONTRAST  TECHNIQUE: Multiplanar, multisequence MR imaging of the lumbar spine was performed. No intravenous contrast was administered.  COMPARISON:  Lumbar spine radiographs 01/08/2010  FINDINGS: Five lumbar type vertebral bodies. There is slight left convex curvature of the lumbar  spine. Trace retrolisthesis is noted of L2 on L3. There is disc desiccation and moderate disc space narrowing throughout the lumbar spine with multilevel degenerative endplate changes including mild edema. Vertebral body heights are preserved. The lumbar spinal canal is mildly narrow diffusely on a congenital basis due to short pedicles. The conus medullaris is normal in signal and terminates at the superior aspect of L1. Paraspinal soft tissues are unremarkable.  T12-L1:  Mild disc bulging without stenosis.  L1-2: Mild circumferential disc bulging with small superimposed central disc extrusion and minimal facet hypertrophy. These changes along with congenitally short pedicles contribute to mild spinal stenosis and mild right neural foraminal stenosis.  L2-3: Moderate circumferential disc bulging, mild facet hypertrophy, and congenitally short pedicles result in mild-to-moderate spinal stenosis, moderate bilateral lateral recess stenosis, and mild bilateral neural foraminal stenosis.  L3-4: Mild-to-moderate circumferential disc bulging, mild facet hypertrophy, and congenitally short pedicles result in moderate spinal stenosis, moderate bilateral lateral recess stenosis, and mild bilateral neural foraminal stenosis.  L4-5: Mild-to-moderate circumferential disc bulging, mild facet hypertrophy, and congenitally short pedicles result in moderate spinal stenosis, moderate right and severe left lateral recess stenosis, and moderate bilateral neural foraminal stenosis.  L5-S1: Mild disc bulging and mild facet hypertrophy result in mild left greater than right lateral recess stenosis and mild bilateral neural foraminal stenosis. No spinal stenosis.  IMPRESSION: Moderate diffuse lumbar disc degeneration superimposed on a congenitally narrow lumbar spinal canal, worst at L4-5 where spinal stenosis is moderate.   Electronically Signed   By: Sebastian AcheAllen  Grady   On: 01/15/2015 20:05     PERTINENT LAB RESULTS: CBC:  Recent  Labs  01/15/15 1355  WBC 6.7  HGB 15.3*  HCT 43.8  PLT 252   CMET CMP     Component Value Date/Time   NA 135 01/15/2015 1355   K 3.9 01/15/2015 1355   CL 99 01/15/2015 1355   CO2 27 01/15/2015 1355   GLUCOSE 104* 01/15/2015 1355   BUN 7 01/15/2015 1355   CREATININE 0.72 01/15/2015 1355   CALCIUM 9.5 01/15/2015 1355   PROT 7.5 01/15/2015 1355   ALBUMIN 4.3 01/15/2015 1355   AST 28 01/15/2015 1355   ALT 17 01/15/2015 1355   ALKPHOS 67 01/15/2015 1355   BILITOT 0.6 01/15/2015 1355   GFRNONAA >90 01/15/2015 1355   GFRAA >90 01/15/2015 1355    GFR Estimated Creatinine Clearance: 55.7 mL/min (by C-G formula based on Cr of 0.72). No results for input(s): LIPASE, AMYLASE in the last 72 hours.  Recent Labs  01/15/15 1355  TROPONINI <0.03   Invalid input(s): POCBNP No results for input(s): DDIMER in the last 72 hours.  Recent Labs  01/16/15 0950  HGBA1C 6.0*    Recent Labs  01/16/15 0255  CHOL 163  HDL 73  LDLCALC 79  TRIG 53  CHOLHDL 2.2   No results for input(s): TSH, T4TOTAL, T3FREE, THYROIDAB in the last 72 hours.  Invalid input(s): FREET3 No results for input(s): VITAMINB12, FOLATE, FERRITIN, TIBC, IRON, RETICCTPCT in the last 72  hours. Coags:  Recent Labs  01/15/15 1355  INR 0.93   Microbiology: No results found for this or any previous visit (from the past 240 hour(s)).   BRIEF HOSPITAL COURSE:   Principal Problem: Acute CVA: Presented with R leg weakness and numbness, no other neurological symptoms. Has not taken HTN meds in number of years. Symptoms have since resolved while admitted. CT head negative. MRI Brain shows acute parasagittal infarcts in the L frontal and bilateral parietal lobes. 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 2-D echocardiogram showed preserved ejection fraction, there was no embolic foci evident. LDL elevated at 79 and noted cold, A1c 6.0.Discussed with on-call neurologist today-Dr. Providence Crosby recommended dual antiplatelet therapy with aspirin and Plavix, statin and aggressive risk factor control. No further workup recommended at this time, stable for discharge. Per patient and family, they will follow up with their own neurologist at [facility] for further continued needs. Patient and family emphasize regarding the importance of stopping tobacco abuse, controlling hypertension better and be compliant to medications and to follow-up.  Hypertension: Will start lisinopril, further optimization/titration to be done in the outpatient setting  Dyslipidemia: Continue statin on discharge, further optimization/titration to be done in the outpatient setting. Repeat lipid panel in 3-6 months  Tobacco abuse: Counseled extensively-would defer further pharmacological treatment to the outpatient setting. Patient is interested in quitting, will provide transdermal nicotine the patient sees primary care practitioner  TODAY-DAY OF DISCHARGE:  Subjective:   Olivia Lin today has no headache,no chest abdominal pain,no new weakness tingling or numbness, feels much better wants to go home today.  Objective:   Blood pressure 153/91, pulse 70, temperature 97.9 F (36.6 C), temperature source Oral, resp. rate 16, height 5' (1.524 m), weight 47.174 kg (104 lb), SpO2 100 %. No intake or output data in the 24 hours ending 01/17/15 1300 Filed Weights   01/15/15 1044 01/15/15 2222  Weight: 52.164 kg (115 lb) 47.174 kg (104 lb)    Exam Awake Alert, Oriented *3, No new F.N deficits, Normal affect [place].AT,PERRAL Supple Neck,No JVD, No cervical lymphadenopathy appriciated.  Symmetrical Chest wall movement, Good air movement  bilaterally, CTAB RRR,No Gallops,Rubs or new Murmurs, No Parasternal Heave +ve B.Sounds, Abd Soft, Non tender, No organomegaly appriciated, No rebound -guarding or rigidity. No Cyanosis, Clubbing or edema, No new Rash or bruise  DISCHARGE CONDITION: Stable  DISPOSITION: Home with outpatient physical therapy  DISCHARGE INSTRUCTIONS:    Activity:  As tolerated   Diet recommendation: Diabetic Diet  Discharge Instructions    Call MD for:    Complete by:  As directed   Speech difficulty, unilateral weakness, dizziness     Diet - low sodium heart healthy    Complete by:  As directed      Increase activity slowly    Complete by:  As directed            Follow-up Information    Follow up with Cheral Bay, MD.   Specialty:  Family Medicine   Why:  appointment at 01/23/15 at 2:45 pm   Contact information:   [address] [facility] [address] [phone]       Follow up with Curt Bears, MD. Schedule an  appointment as soon as possible for a visit in 1 month.   Specialty:  Neurology   Contact information:   [address] [place] [address] [phone]       Total Time spent on discharge equals  45 minutes.  SignedJeoffrey Massed 01/17/2015 1:00 PM

## 2015-01-17 NOTE — Progress Notes (Signed)
Discharge orders received. Pt and daughter educated on d/c instructions and stoke education. They verbalized understanding. Pt given d/c packet and prescriptions. IV and tele removed. Pt taken downstairs by staff.

## 2015-01-17 NOTE — Progress Notes (Signed)
Physical Therapy Treatment Patient Details Name: Olivia Lin MRN: [id number] DOB: Dec 11, 1955 Today's Date: 01/17/2015    History of Present Illness Patient is a 59 y/o female with PMH of HTN and tobacco abuse, who presents with right leg weakness and numbness. Pt tripped and fell 2 days PTA and fell on right hip. 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     PT Comments    Patient progressing well with mobility. Foot clearance improved on RLE during gait. Tolerated stair negotiation and performing higher level balance activities without LOB. Able to perform duties to simulate return to work - carrying boxes, reaching to pick up object from floor etc,. All goals met except ambulation goal as pt still requires supervision at times for safety. Education provided on signs/symptoms of CVA. Would benefit from OPPT to improve higher level balance so pt can return to PLOF. Will discharge from acute skilled PT as pt able to function with Mod I-S level and will have assist at home.    Follow Up Recommendations  Outpatient PT;Supervision/Assistance - 24 hour     Equipment Recommendations  None recommended by PT    Recommendations for Other Services       Precautions / Restrictions Precautions Precautions: Fall Restrictions Weight Bearing Restrictions: No    Mobility  Bed Mobility Overal bed mobility: Modified Independent             General bed mobility comments: in chair on arrival  Transfers Overall transfer level: Modified independent Equipment used: None Transfers: Sit to/from Stand Sit to Stand: Modified independent (Device/Increase time)            Ambulation/Gait Ambulation/Gait assistance: Supervision Ambulation Distance (Feet): 400 Feet Assistive device: None Gait Pattern/deviations: Step-through pattern;Decreased stride length;Narrow base of support   Gait velocity interpretation: Below normal  speed for age/gender General Gait Details: Pt with mostly steady gait with minor deviations noted with higher level balance challenges. Foot clearance improved on RLE with cues to initiate with heel strike.   Stairs Stairs: Yes Stairs assistance: Supervision Stair Management: One rail Right;Step to pattern;Alternating pattern Number of Stairs: 12 General stair comments: Alternating pattern to ascend steps and step to pattern to descend steps. Cues for technique. Supervision for safety.   Wheelchair Mobility    Modified Rankin (Stroke Patients Only) Modified Rankin (Stroke Patients Only) Pre-Morbid Rankin Score: No symptoms Modified Rankin: Slight disability     Balance Overall balance assessment: Needs assistance Sitting-balance support: Feet supported;No upper extremity supported Sitting balance-Leahy Scale: Good     Standing balance support: During functional activity Standing balance-Leahy Scale: Good   Single Leg Stance - Right Leg: 5 Single Leg Stance - Left Leg: 30         High level balance activites: Side stepping;Backward walking;Direction changes;Turns;Sudden stops High Level Balance Comments: Tolerated above challenges with only minor deviations in gait but no LOB. Able to pick up objects from floor and transfer box across room demonstrating good body mechanics.     Cognition Arousal/Alertness: Awake/alert Behavior During Therapy: WFL for tasks assessed/performed Overall Cognitive Status: Within Functional Limits for tasks assessed                      Exercises      General Comments General comments (skin integrity, edema, etc.): Pt's daughter present during session.      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   PT Goals (current goals can now be found in the care plan section) Acute Rehab PT Goals Patient Stated Goal: to regain independence Progress towards PT goals: Goals met/education completed, patient discharged from PT    Frequency  Min 4X/week    PT Plan Current plan remains appropriate    Co-evaluation             End of Session Equipment Utilized During Treatment: Gait belt Activity Tolerance: Patient tolerated treatment well Patient left: in bed;with call bell/phone within reach;with family/visitor present     Time: 7867-6720 PT Time Calculation (min) (ACUTE ONLY): 17 min  Charges:  $Gait Training: 8-22 mins                    G CodesCandy Sledge A 01/31/2015, 10:28 AM Candy Sledge, [place], DPT [phone]

## 2015-02-11 ENCOUNTER — Other Ambulatory Visit: Payer: Self-pay | Admitting: Internal Medicine

## 2015-02-14 ENCOUNTER — Other Ambulatory Visit: Payer: Self-pay | Admitting: Internal Medicine

## 2015-12-10 IMAGING — CT CT ANGIO HEAD
1 of 11 series · 5 of 47 positions shown · IV contrast (OMNI)
Comparison: Brain MRI 01/15/2015.  Head CT 01/15/2015.

ADDENDUM:
Study discussed by telephone with Dr. Shimizu On 01/16/2015 at 4838
hrs.
CLINICAL DATA: 58-year-old female with right side weakness
diagnosed with acute parasagittal bilateral cerebral infarcts on
MRI. Initial encounter.

EXAM:
CT ANGIOGRAPHY HEAD AND NECK
TECHNIQUE: Multidetector CT imaging of the head and neck was performed using
the standard protocol during bolus administration of intravenous
contrast. Multiplanar CT image reconstructions and MIPs were
obtained to evaluate the vascular anatomy. Carotid stenosis
measurements (when applicable) are obtained utilizing NASCET
criteria, using the distal internal carotid diameter as the
denominator.
CONTRAST:  100mL OMNIPAQUE IOHEXOL 350 MG/ML SOLN

[Series 8: carotid/brain 2.0 i30f 3 · axial · 0.44mm/px · z∈[-284,-52]mm · 5 of 167 slices shown]
[im 28/167  brain]
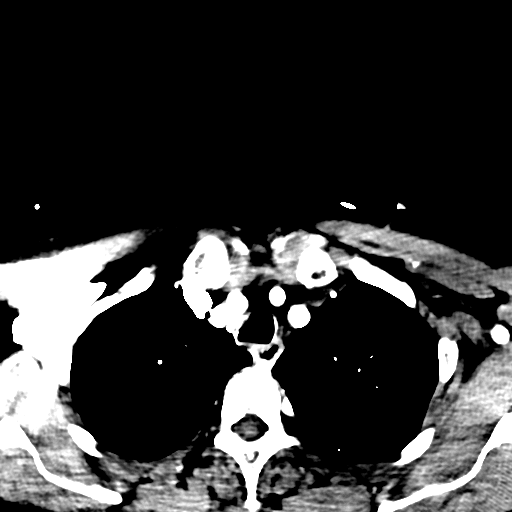
[im 56/167  bone]
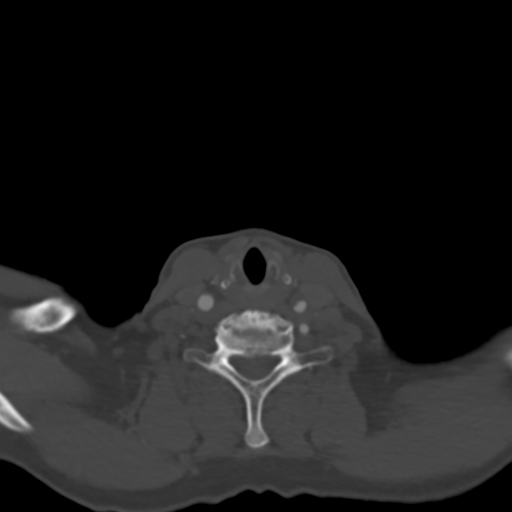
[im 84/167  brain]
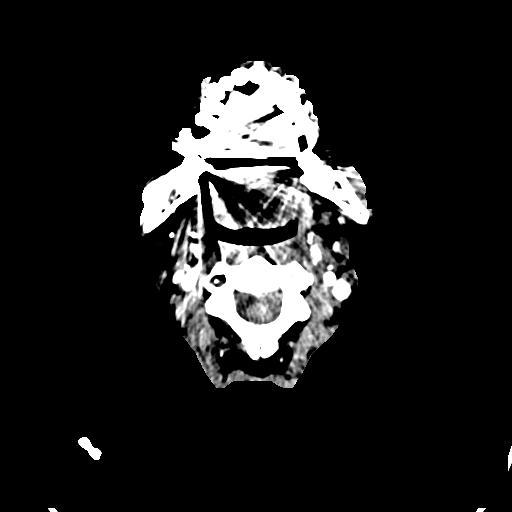
[im 111/167  bone]
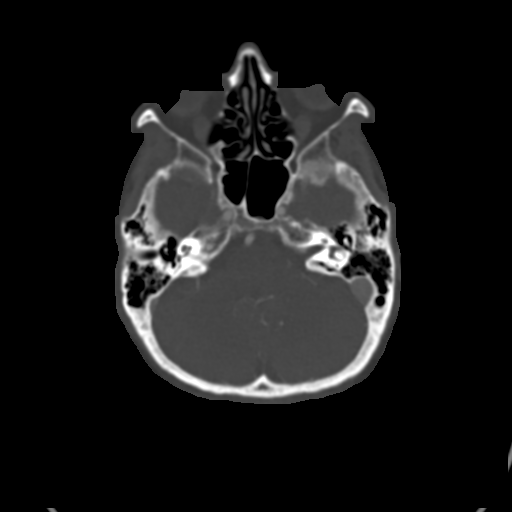
[im 139/167  brain]
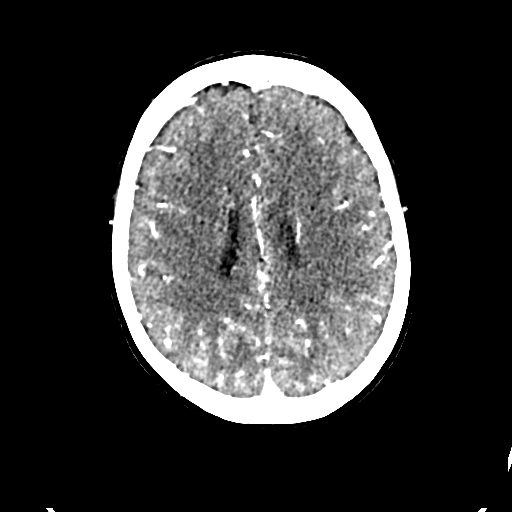

[5 of 47 positions shown; findings below may reference images not displayed]

FINDINGS: CT HEAD

Brain: Small areas of abnormal hypodensity have developed in the
superior frontal and parietal lobes corresponding to the diffusion
abnormality on yesterday's MRI. No associated hemorrhage or mass
effect.

Stable gray-white matter differentiation elsewhere. No
ventriculomegaly. No acute intracranial hemorrhage identified.

Calvarium and skull base: Stable.

Paranasal sinuses: Stable.

Orbits: Negative.

CTA NECK

Skeleton: Degenerative changes in the cervical spine maximal at
C5-C6 and C6-C7. No acute osseous abnormality identified.

Other neck: No cervical lymphadenopathy.

No superior mediastinal lymphadenopathy. There are numerous
scattered calcified granulomas in both upper lungs.

Negative thyroid, larynx, pharynx, parapharyngeal spaces,
retropharyngeal space, sublingual space, submandibular glands, and
parotid glands.

Aortic arch: 3 vessel arch configuration with mild proximal arch
atherosclerosis, however there is more moderate distal arch and
proximal descending thoracic aorta soft and calcified plaque. Some
areas of small ulcerated plaque are suspected (series 8, image 20).

Right carotid system: Normal right CCA origin. Normal right carotid
bifurcation. Soft and calcified plaque in the medial right ICA bulb
without stenosis.

Left carotid system: Negative left CCA origin. Soft plaque in the
mid left CCA along the anterior wall. Plaque measures up to 2 mm in
thickness, but does not result in hemodynamically significant
stenosis (less than 50 % with respect to the distal vessel). This
soft plaque abates at the left carotid bifurcation. There is
calcified plaque involving the left ECA origin, but the left ICA
origin and bulb are widely patent. Negative cervical left ICA.

Vertebral arteries:No proximal right subclavian artery stenosis.
Nonvisualization of the right vertebral artery origin and V1
segment. There is reconstituted enhancement of the right vertebral
artery in the V2 segment beginning at the C4-C5 level. It appears to
be non dominant. There is intermittent poor enhancement of the right
V2 segment, an the vessel is Re occluded at the C1 level.

Dominant appearing left vertebral artery 0 has a normal origin with
no proximal left subclavian artery stenosis. Negative left vertebral
artery to the skullbase.

CTA HEAD

Posterior circulation: Dominant left vertebral artery supplies the
basilar. The non dominant right vertebral artery is diminutive and
re- occludes at the skullbase as detailed above. There is probable
retrograde enhancement of the right V4 segment and right PICA
origin. Normal left PICA.

No basilar artery stenosis. SCA and PCA origins are within normal
limits. Posterior communicating arteries are diminutive or absent.
Bilateral PCA branches are within normal limits.

Anterior circulation: Both ICA siphons are patent without stenosis
or calcified plaque. Ophthalmic artery origins are within normal
limits. Normal carotid termini. Normal MCA and ACA origins.

Anterior communicating artery is within normal limits. There is a
dominant appearing median artery of the corpus callosum which
occludes about 12 mm beyond its origin there are patent smaller A2
and distal bilateral ACA segments.

Right MCA branches are within normal limits. Left MCA branches are
within normal limits.

Venous sinuses: Patent.

Anatomic variants: Non dominant right vertebral artery.

Delayed phase: No abnormal enhancement identified.
IMPRESSION: 1. Occluded dominant appearing median artery of the corpus callosum
(somewhat simulating azygos type ACA anatomy) is favored to be the
causative lesion of the left greater than right parasagittal acute
infarcts demonstrated yesterday.
2. Prominent soft plaque in the left common carotid artery (see
series 8, image 51) without carotid hemodynamically significant
stenosis in the neck or at the skullbase.
3. Non dominant appearing right vertebral artery origin is occluded,
and there is poor reconstitution of the vessel in the right V2
segment, such that it re- occludes just proximal to the skullbase.
4. Dominant left vertebral artery is normal and likely supplies the
right V4 segment and right PICA in a retrograde fashion.
5. Expected evolution of the small cerebral infarcts on CT. No
hemorrhage or mass effect.
6. Sequelae of previous pulmonary granulomatous disease.
